# Patient Record
Sex: Male | Born: 1955 | Race: White | Marital: Married | Smoking: Current every day smoker
Health system: Southern US, Community
[De-identification: ages and names within clinical notes are randomized; demographics above are authoritative.]

## PROBLEM LIST (undated history)

## (undated) DIAGNOSIS — F329 Major depressive disorder, single episode, unspecified: Secondary | ICD-10-CM

## (undated) DIAGNOSIS — D126 Benign neoplasm of colon, unspecified: Secondary | ICD-10-CM

## (undated) DIAGNOSIS — I714 Abdominal aortic aneurysm, without rupture, unspecified: Secondary | ICD-10-CM

## (undated) DIAGNOSIS — F1021 Alcohol dependence, in remission: Secondary | ICD-10-CM

## (undated) DIAGNOSIS — F419 Anxiety disorder, unspecified: Secondary | ICD-10-CM

## (undated) DIAGNOSIS — K219 Gastro-esophageal reflux disease without esophagitis: Secondary | ICD-10-CM

## (undated) DIAGNOSIS — E7800 Pure hypercholesterolemia, unspecified: Secondary | ICD-10-CM

## (undated) DIAGNOSIS — G44019 Episodic cluster headache, not intractable: Secondary | ICD-10-CM

## (undated) DIAGNOSIS — I10 Essential (primary) hypertension: Secondary | ICD-10-CM

## (undated) DIAGNOSIS — F32A Depression, unspecified: Secondary | ICD-10-CM

## (undated) DIAGNOSIS — E785 Hyperlipidemia, unspecified: Secondary | ICD-10-CM

## (undated) DIAGNOSIS — R319 Hematuria, unspecified: Secondary | ICD-10-CM

## (undated) HISTORY — DX: Pure hypercholesterolemia, unspecified: E78.00

## (undated) HISTORY — DX: Alcohol dependence, in remission: F10.21

## (undated) HISTORY — DX: Essential (primary) hypertension: I10

## (undated) HISTORY — PX: TONSILLECTOMY: SUR1361

## (undated) HISTORY — DX: Episodic cluster headache, not intractable: G44.019

## (undated) HISTORY — DX: Gastro-esophageal reflux disease without esophagitis: K21.9

## (undated) HISTORY — DX: Abdominal aortic aneurysm, without rupture: I71.4

## (undated) HISTORY — DX: Hyperlipidemia, unspecified: E78.5

## (undated) HISTORY — DX: Abdominal aortic aneurysm, without rupture, unspecified: I71.40

## (undated) HISTORY — DX: Benign neoplasm of colon, unspecified: D12.6

## (undated) HISTORY — DX: Anxiety disorder, unspecified: F41.9

## (undated) HISTORY — DX: Depression, unspecified: F32.A

## (undated) HISTORY — DX: Hematuria, unspecified: R31.9

## (undated) HISTORY — PX: TENDON GRAFT: SHX2486

---
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
## 2015-07-10 ENCOUNTER — Other Ambulatory Visit: Payer: Self-pay | Admitting: Orthopedic Surgery

## 2015-07-10 DIAGNOSIS — M25511 Pain in right shoulder: Secondary | ICD-10-CM

## 2015-07-26 ENCOUNTER — Other Ambulatory Visit: Payer: Self-pay

## 2018-03-05 ENCOUNTER — Ambulatory Visit
Admission: RE | Admit: 2018-03-05 | Discharge: 2018-03-05 | Disposition: A | Discharge status: Home / Self Care | Payer: BC Managed Care – PPO | Source: Ambulatory Visit | Attending: Family Medicine | Admitting: Family Medicine

## 2018-03-05 ENCOUNTER — Other Ambulatory Visit: Payer: Self-pay | Admitting: Family Medicine

## 2018-03-05 DIAGNOSIS — R05 Cough: Secondary | ICD-10-CM

## 2018-03-05 DIAGNOSIS — R059 Cough, unspecified: Secondary | ICD-10-CM

## 2018-03-05 DIAGNOSIS — R042 Hemoptysis: Secondary | ICD-10-CM

## 2018-08-04 DIAGNOSIS — M79671 Pain in right foot: Secondary | ICD-10-CM | POA: Insufficient documentation

## 2018-08-31 DIAGNOSIS — S93139A Subluxation of interphalangeal joint of unspecified toe(s), initial encounter: Secondary | ICD-10-CM | POA: Insufficient documentation

## 2018-08-31 DIAGNOSIS — M25571 Pain in right ankle and joints of right foot: Secondary | ICD-10-CM | POA: Insufficient documentation

## 2018-08-31 DIAGNOSIS — Q6651 Congenital pes planus, right foot: Secondary | ICD-10-CM | POA: Insufficient documentation

## 2018-08-31 DIAGNOSIS — M2011 Hallux valgus (acquired), right foot: Secondary | ICD-10-CM | POA: Insufficient documentation

## 2020-06-21 ENCOUNTER — Other Ambulatory Visit: Payer: Self-pay | Admitting: Family Medicine

## 2020-06-21 DIAGNOSIS — R319 Hematuria, unspecified: Secondary | ICD-10-CM

## 2020-06-22 ENCOUNTER — Other Ambulatory Visit: Payer: Self-pay

## 2020-06-22 ENCOUNTER — Ambulatory Visit
Admission: RE | Admit: 2020-06-22 | Discharge: 2020-06-22 | Disposition: A | Discharge status: Home / Self Care | Payer: BC Managed Care – PPO | Source: Ambulatory Visit | Attending: Family Medicine | Admitting: Family Medicine

## 2020-06-22 DIAGNOSIS — R319 Hematuria, unspecified: Secondary | ICD-10-CM

## 2020-06-22 MED ORDER — IOPAMIDOL (ISOVUE-300) INJECTION 61%
125.0000 mL | Freq: Once | INTRAVENOUS | Status: AC | PRN
  Administered 2020-06-22: 125 mL via INTRAVENOUS

## 2020-07-04 ENCOUNTER — Other Ambulatory Visit: Payer: BC Managed Care – PPO

## 2020-07-06 ENCOUNTER — Other Ambulatory Visit: Payer: Self-pay

## 2020-07-06 ENCOUNTER — Encounter: Payer: Self-pay | Admitting: Vascular Surgery

## 2020-07-06 ENCOUNTER — Ambulatory Visit (INDEPENDENT_AMBULATORY_CARE_PROVIDER_SITE_OTHER): Payer: BC Managed Care – PPO | Admitting: Vascular Surgery

## 2020-07-06 ENCOUNTER — Telehealth: Payer: Self-pay

## 2020-07-06 VITALS — BP 154/89 | HR 73 | Temp 98.6°F | Resp 20 | Ht 71.0 in | Wt 233.0 lb

## 2020-07-06 DIAGNOSIS — I714 Abdominal aortic aneurysm, without rupture, unspecified: Secondary | ICD-10-CM

## 2020-07-06 NOTE — Progress Notes (Addendum)
Referring Physician: Dr Marisue Humble  Patient name: Benjamin Preston MRN: [id number] DOB: [birth year] Sex: male  REASON FOR CONSULT: Abdominal aortic aneurysm  HPI: Benjamin Preston is a 65 y.o. male, found to have a 7 cm infrarenal abdominal aortic aneurysm on CT scan recently done for hematuria.  Patient has no abdominal or back pain.  He has no family history of abdominal aortic aneurysm.  He does smoke about 1/4 pack of cigarettes per day.  He has a 40-year smoking history.  He is trying to quit.  He has never had any abdominal operations.  He gets short of breath with severe exertion.  However he can climb 2 flights of stairs without getting short of breath.  He has no chest pain.  He has no prior history of stroke.  Other medical problems include depression, hyperlipidemia hypertension all of which are currently stable.  Past Medical History:  Diagnosis Date  . Anxiety   . Depression   . Hyperlipidemia   . Hypertension    Past Surgical History:  Procedure Laterality Date  . TENDON GRAFT    . TONSILLECTOMY      History reviewed. No pertinent family history.  SOCIAL HISTORY: Social History   Socioeconomic History  . Marital status: Married    Spouse name: Not on file  . Number of children: Not on file  . Years of education: Not on file  . Highest education level: Not on file  Occupational History  . Not on file  Tobacco Use  . Smoking status: Current Every Day Smoker    Packs/day: 0.50  . Smokeless tobacco: Never Used  Vaping Use  . Vaping Use: Never used  Substance and Sexual Activity  . Alcohol use: Not Currently  . Drug use: Never  . Sexual activity: Not on file  Other Topics Concern  . Not on file  Social History Narrative  . Not on file   Social Determinants of Health   Financial Resource Strain:   . Difficulty of Paying Living Expenses: Not on file  Food Insecurity:   . Worried About Charity fundraiser in the Last Year: Not on file  . Ran Out of Food in the  Last Year: Not on file  Transportation Needs:   . Lack of Transportation (Medical): Not on file  . Lack of Transportation (Non-Medical): Not on file  Physical Activity:   . Days of Exercise per Week: Not on file  . Minutes of Exercise per Session: Not on file  Stress:   . Feeling of Stress : Not on file  Social Connections:   . Frequency of Communication with Friends and Family: Not on file  . Frequency of Social Gatherings with Friends and Family: Not on file  . Attends Religious Services: Not on file  . Active Member of Clubs or Organizations: Not on file  . Attends Archivist Meetings: Not on file  . Marital Status: Not on file  Intimate Partner Violence:   . Fear of Current or Ex-Partner: Not on file  . Emotionally Abused: Not on file  . Physically Abused: Not on file  . Sexually Abused: Not on file    No Known Allergies  Current Outpatient Medications  Medication Sig Dispense Refill  . ALPRAZolam (XANAX) 0.5 MG tablet TK 1/2 TO 1 T PO QD PRN    . escitalopram (LEXAPRO) 10 MG tablet Take 10 mg by mouth daily.    . finasteride (PROSCAR) 5 MG tablet Take 5 mg  by mouth daily.    . metoprolol succinate (TOPROL-XL) 25 MG 24 hr tablet Take 25 mg by mouth daily.    . simvastatin (ZOCOR) 20 MG tablet Take 20 mg by mouth daily.     No current facility-administered medications for this visit.    ROS:   General:  No weight loss, Fever, chills  HEENT: No recent headaches, no nasal bleeding, no visual changes, no sore throat  Neurologic: No dizziness, blackouts, seizures. No recent symptoms of stroke or mini- stroke. No recent episodes of slurred speech, or temporary blindness.  Cardiac: No recent episodes of chest pain/pressure, no shortness of breath at rest.  No shortness of breath with exertion.  Denies history of atrial fibrillation or irregular heartbeat  Vascular: No history of rest pain in feet.  No history of claudication.  No history of non-healing ulcer, No  history of DVT   Pulmonary: No home oxygen, no productive cough, no hemoptysis,  No asthma or wheezing  Musculoskeletal:  [ ]  Arthritis, [ ]  Low back pain,  [ ]  Joint pain  Hematologic:No history of hypercoagulable state.  No history of easy bleeding.  No history of anemia  Gastrointestinal: No hematochezia or melena,  No gastroesophageal reflux, no trouble swallowing  Urinary: [ ]  chronic Kidney disease, [ ]  on HD - [ ]  MWF or [ ]  TTHS, [ ]  Burning with urination, [ ]  Frequent urination, [ ]  Difficulty urinating;   Skin: No rashes  Psychological: No history of anxiety,  No history of depression   Physical Examination  Vitals:   07/06/20 0835  BP: (!) 154/89  Pulse: 73  Resp: 20  Temp: 98.6 F (37 C)  SpO2: 94%  Weight: 233 lb (105.7 kg)  Height: 5\' 11"  (1.803 m)    Body mass index is 32.5 kg/m.  General:  Alert and oriented, no acute distress HEENT: Normal Neck: No JVD Cardiac: Regular Rate and Rhythm without murmur Abdomen: Soft, non-tender, non-distended, slight epigastric mass consistent with 7 cm aneurysm nontender Skin: No rash Extremity Pulses:  2+ radial, brachial, femoral, dorsalis pedis, posterior tibial pulses bilaterally Musculoskeletal: No deformity or edema  Neurologic: Upper and lower extremity motor 5/5 and symmetric  DATA:  I reviewed the patient's CT scan of the abdomen and pelvis dated June 22, 2020.  Shows a 7 cm infrarenal abdominal aortic aneurysm with some neck tortuosity.  The neck diameter is about 28 mm.  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  He also has very tortuous iliacs.  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  Ectatic left iliac.  Size needs to be considered for repair.  I have some concerns about the tortuosity of the iliacs and aortic neck that this may not accommodate an aortic  stent graft.  I think there is a high likelihood we will need to do an open repair but will review these images with the [employer] rep to see if the device may fit.  If not he would need an open repair.  I discussed with him today the possibility of open repair with risks benefits possible complications of procedure details including but not limited to risk of rupture with no repair 5 to 10 %/year.  Risk of death with operation 1 to 2%.  Risk of transfusion 20%.  Other small risks pneumonias infections hernia intestinal issues all fairly low  risk.   PLAN: Patient will be scheduled for cardiology risk stratification appointment.  I reviewed the CT images and make a final determination on open versus stent graft repair in the next few days.  I will call the patient regarding this.  Ruta Hinds, MD [facility] Office: [phone]  Addendum: July 07, 2020 at 11 AM.  We made measurements off of his CT scan today.  His aneurysm configuration should be okay for aneurysm stent graft rather than open repair.  We will need to do this as a two-stage procedure.  We will get him scheduled for coil embolization of his right internal iliac either on August 27 or September 3.  We will contact the patient regarding this.  He has a cardiology evaluation scheduled for August 24 with Dr. Irish Lack.

## 2020-07-06 NOTE — Telephone Encounter (Signed)
Received message from [facility] requesting ASAP new patient appointment for surgical clearance.  Spoke with Dr. Hassell Done nurse and arrange a visit with the patient 8/24 at [facility].  Left message for the patient to call back.

## 2020-07-06 NOTE — Telephone Encounter (Signed)
Confirmed appointment with patient 8/24 with Dr. Irish Lack. He was grateful for call and agrees with plan.

## 2020-07-06 NOTE — H&P (View-Only) (Signed)
Referring Physician: Dr Marisue Humble  Patient name: Benjamin Preston MRN: [id number] DOB: Sep 16, 1956 Sex: male  REASON FOR CONSULT: Abdominal aortic aneurysm  HPI: Yoseph Haile is a 64 y.o. male, found to have a 7 cm infrarenal abdominal aortic aneurysm on CT scan recently done for hematuria.  Patient has no abdominal or back pain.  He has no family history of abdominal aortic aneurysm.  He does smoke about 1/4 pack of cigarettes per day.  He has a 40-year smoking history.  He is trying to quit.  He has never had any abdominal operations.  He gets short of breath with severe exertion.  However he can climb 2 flights of stairs without getting short of breath.  He has no chest pain.  He has no prior history of stroke.  Other medical problems include depression, hyperlipidemia hypertension all of which are currently stable.  Past Medical History:  Diagnosis Date  . Anxiety   . Depression   . Hyperlipidemia   . Hypertension    Past Surgical History:  Procedure Laterality Date  . TENDON GRAFT    . TONSILLECTOMY      History reviewed. No pertinent family history.  SOCIAL HISTORY: Social History   Socioeconomic History  . Marital status: Married    Spouse name: Not on file  . Number of children: Not on file  . Years of education: Not on file  . Highest education level: Not on file  Occupational History  . Not on file  Tobacco Use  . Smoking status: Current Every Day Smoker    Packs/day: 0.50  . Smokeless tobacco: Never Used  Vaping Use  . Vaping Use: Never used  Substance and Sexual Activity  . Alcohol use: Not Currently  . Drug use: Never  . Sexual activity: Not on file  Other Topics Concern  . Not on file  Social History Narrative  . Not on file   Social Determinants of Health   Financial Resource Strain:   . Difficulty of Paying Living Expenses: Not on file  Food Insecurity:   . Worried About Charity fundraiser in the Last Year: Not on file  . Ran Out of Food in the  Last Year: Not on file  Transportation Needs:   . Lack of Transportation (Medical): Not on file  . Lack of Transportation (Non-Medical): Not on file  Physical Activity:   . Days of Exercise per Week: Not on file  . Minutes of Exercise per Session: Not on file  Stress:   . Feeling of Stress : Not on file  Social Connections:   . Frequency of Communication with Friends and Family: Not on file  . Frequency of Social Gatherings with Friends and Family: Not on file  . Attends Religious Services: Not on file  . Active Member of Clubs or Organizations: Not on file  . Attends Archivist Meetings: Not on file  . Marital Status: Not on file  Intimate Partner Violence:   . Fear of Current or Ex-Partner: Not on file  . Emotionally Abused: Not on file  . Physically Abused: Not on file  . Sexually Abused: Not on file    No Known Allergies  Current Outpatient Medications  Medication Sig Dispense Refill  . ALPRAZolam (XANAX) 0.5 MG tablet TK 1/2 TO 1 T PO QD PRN    . escitalopram (LEXAPRO) 10 MG tablet Take 10 mg by mouth daily.    . finasteride (PROSCAR) 5 MG tablet Take 5 mg  by mouth daily.    . metoprolol succinate (TOPROL-XL) 25 MG 24 hr tablet Take 25 mg by mouth daily.    . simvastatin (ZOCOR) 20 MG tablet Take 20 mg by mouth daily.     No current facility-administered medications for this visit.    ROS:   General:  No weight loss, Fever, chills  HEENT: No recent headaches, no nasal bleeding, no visual changes, no sore throat  Neurologic: No dizziness, blackouts, seizures. No recent symptoms of stroke or mini- stroke. No recent episodes of slurred speech, or temporary blindness.  Cardiac: No recent episodes of chest pain/pressure, no shortness of breath at rest.  No shortness of breath with exertion.  Denies history of atrial fibrillation or irregular heartbeat  Vascular: No history of rest pain in feet.  No history of claudication.  No history of non-healing ulcer, No  history of DVT   Pulmonary: No home oxygen, no productive cough, no hemoptysis,  No asthma or wheezing  Musculoskeletal:  [ ]  Arthritis, [ ]  Low back pain,  [ ]  Joint pain  Hematologic:No history of hypercoagulable state.  No history of easy bleeding.  No history of anemia  Gastrointestinal: No hematochezia or melena,  No gastroesophageal reflux, no trouble swallowing  Urinary: [ ]  chronic Kidney disease, [ ]  on HD - [ ]  MWF or [ ]  TTHS, [ ]  Burning with urination, [ ]  Frequent urination, [ ]  Difficulty urinating;   Skin: No rashes  Psychological: No history of anxiety,  No history of depression   Physical Examination  Vitals:   07/06/20 0835  BP: (!) 154/89  Pulse: 73  Resp: 20  Temp: 98.6 F (37 C)  SpO2: 94%  Weight: 233 lb (105.7 kg)  Height: 5\' 11"  (1.803 m)    Body mass index is 32.5 kg/m.  General:  Alert and oriented, no acute distress HEENT: Normal Neck: No JVD Cardiac: Regular Rate and Rhythm without murmur Abdomen: Soft, non-tender, non-distended, slight epigastric mass consistent with 7 cm aneurysm nontender Skin: No rash Extremity Pulses:  2+ radial, brachial, femoral, dorsalis pedis, posterior tibial pulses bilaterally Musculoskeletal: No deformity or edema  Neurologic: Upper and lower extremity motor 5/5 and symmetric  DATA:  I reviewed the patient's CT scan of the abdomen and pelvis dated June 22, 2020.  Shows a 7 cm infrarenal abdominal aortic aneurysm with some neck tortuosity.  The neck diameter is about 28 mm.  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  He also has very tortuous iliacs.  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  Ectatic left iliac.  Size needs to be considered for repair.  I have some concerns about the tortuosity of the iliacs and aortic neck that this may not accommodate an aortic  stent graft.  I think there is a high likelihood we will need to do an open repair but will review these images with the [employer] rep to see if the device may fit.  If not he would need an open repair.  I discussed with him today the possibility of open repair with risks benefits possible complications of procedure details including but not limited to risk of rupture with no repair 5 to 10 %/year.  Risk of death with operation 1 to 2%.  Risk of transfusion 20%.  Other small risks pneumonias infections hernia intestinal issues all fairly low  risk.   PLAN: Patient will be scheduled for cardiology risk stratification appointment.  I reviewed the CT images and make a final determination on open versus stent graft repair in the next few days.  I will call the patient regarding this.  Ruta Hinds, MD [facility] Office: [phone]  Addendum: July 07, 2020 at 11 AM.  We made measurements off of his CT scan today.  His aneurysm configuration should be okay for aneurysm stent graft rather than open repair.  We will need to do this as a two-stage procedure.  We will get him scheduled for coil embolization of his right internal iliac either on August 27 or September 3.  We will contact the patient regarding this.  He has a cardiology evaluation scheduled for August 24 with Dr. Irish Lack.

## 2020-07-07 ENCOUNTER — Telehealth: Payer: Self-pay

## 2020-07-07 ENCOUNTER — Other Ambulatory Visit: Payer: Self-pay

## 2020-07-07 NOTE — Telephone Encounter (Signed)
Per Dr. Oneida Alar, I called pt to schedule a Coil Embolization of his internal artery aneurysm using an OSCOR sheath for either 08/27 or 07/21/20.  No answer.  I left voice message asking pt to call me back.  Thurston Hole., LPN

## 2020-07-07 NOTE — Telephone Encounter (Addendum)
Patient Benjamin Preston stating he would like a call from Dr. Oneida Alar directly to talk about the surgery and to ask a few questions. I returned patient's call and advised I will speak with Dr. Oneida Alar on Monday and that he will give him a call to discuss. Patient voiced his understanding.

## 2020-07-11 ENCOUNTER — Ambulatory Visit: Payer: BC Managed Care – PPO | Admitting: Interventional Cardiology

## 2020-07-11 ENCOUNTER — Other Ambulatory Visit: Payer: Self-pay

## 2020-07-11 ENCOUNTER — Encounter: Payer: Self-pay | Admitting: Interventional Cardiology

## 2020-07-11 VITALS — BP 140/96 | HR 69 | Ht 71.0 in | Wt 232.2 lb

## 2020-07-11 DIAGNOSIS — I10 Essential (primary) hypertension: Secondary | ICD-10-CM | POA: Diagnosis not present

## 2020-07-11 DIAGNOSIS — Z720 Tobacco use: Secondary | ICD-10-CM

## 2020-07-11 DIAGNOSIS — E782 Mixed hyperlipidemia: Secondary | ICD-10-CM

## 2020-07-11 DIAGNOSIS — I714 Abdominal aortic aneurysm, without rupture, unspecified: Secondary | ICD-10-CM

## 2020-07-11 DIAGNOSIS — Z01810 Encounter for preprocedural cardiovascular examination: Secondary | ICD-10-CM | POA: Diagnosis not present

## 2020-07-11 MED ORDER — ROSUVASTATIN CALCIUM 20 MG PO TABS: 20.0000 mg | ORAL_TABLET | Freq: Every day | ORAL | 3 refills | Status: AC

## 2020-07-11 NOTE — Progress Notes (Signed)
Cardiology Office Note   Date:  07/11/2020   ID:  Benjamin Preston, DOB 02/02/1956, MRN [id number]  PCP:  Gaynelle Arabian, MD    No chief complaint on file.  Preoperative cardiovascular examination  Wt Readings from Last 3 Encounters:  07/11/20 232 lb 3.2 oz (105.3 kg)  07/06/20 233 lb (105.7 kg)       History of Present Illness: Benjamin Preston is a 64 y.o. male who is being seen today for the evaluation of perioperative risk at the request of Gaynelle Arabian, MD.  He has a history of hypertension.  He was recently found to have a large abdominal aortic aneurysm, 7 cm, noted on a CT scan done for hematuria.  The patient was asymptomatic.  He does have a history of smoking.  It was reported by Dr. Oneida Alar that he can climb 2 flights of stairs without getting short of breath.  He denied chest pain at that time.  Review of the chart shows that initially, it was thought he may need an open repair.  However, it appears there will be chance for stent graft rather than an open repair.  This will be done as a two-stage procedure.  Today in the office, he feels well.  He reports some soreness in his knees.  Feels he has some age appropriate fatigue.  Denies : Chest pain. Dizziness. Leg edema. Nitroglycerin use. Orthopnea. Palpitations. Paroxysmal nocturnal dyspnea. Shortness of breath. Syncope.   He walks daily for 15 minutes, without any cardiac sx.  He has joint pain.    He walks up 2 flights of stairs without any problems.  He is cutting back on smoking.  No early family h/o CAD.  Past Medical History:  Diagnosis Date  . AAA (abdominal aortic aneurysm) ([place])   . Adenomatous colon polyp   . Anxiety   . Depression   . GERD (gastroesophageal reflux disease)   . Headache, cluster, episodic   . Hematuria   . Hypercholesterolemia   . Hyperlipidemia   . Hypertension   . 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 TENDON GRAFT    .  TONSILLECTOMY       Current Outpatient Medications  Medication Sig Dispense Refill  . ALPRAZolam (XANAX) 0.5 MG tablet TK 1/2 TO 1 T PO QD PRN    . escitalopram (LEXAPRO) 10 MG tablet Take 10 mg by mouth daily.    . finasteride (PROSCAR) 5 MG tablet Take 5 mg by mouth daily.    . metoprolol succinate (TOPROL-XL) 25 MG 24 hr tablet Take 25 mg by mouth daily.    . simvastatin (ZOCOR) 20 MG tablet Take 20 mg by mouth daily.     No current facility-administered medications for this visit.    Allergies:   Patient has no known allergies.    Social History:  The patient  reports that he has been smoking. He has been smoking about 0.50 packs per day. He has never used smokeless tobacco. He reports previous alcohol use. He reports that he does not use drugs.   Family History:  The patient's family history includes Diverticulitis in his mother; Mesothelioma in his mother; Prostate cancer in his father.    ROS:  Please see the history of present illness.   Otherwise, review of systems are positive for joint pains.   All other systems are reviewed and negative.    PHYSICAL EXAM: VS:  BP (!) 140/96  Pulse 69   Ht 5\' 11"  (1.803 m)   Wt 232 lb 3.2 oz (105.3 kg)   SpO2 95%   BMI 32.39 kg/m  , BMI Body mass index is 32.39 kg/m. GEN: Well nourished, well developed, in no acute distress  HEENT: normal  Neck: no JVD, carotid bruits, or masses Cardiac: RRR; no murmurs, rubs, or gallops,no edema  Respiratory:  clear to auscultation bilaterally, normal work of breathing GI: soft, nontender, nondistended, + BS MS: no deformity or atrophy  Skin: warm and dry, no rash Neuro:  Strength and sensation are intact Psych: euthymic mood, full affect   EKG:   The ekg ordered today demonstrates NSR, no ST changes   Recent Labs: No results found for requested labs within last 8760 hours.   Lipid Panel No results found for: CHOL, TRIG, HDL, CHOLHDL, VLDL, LDLCALC, LDLDIRECT   Other studies  Reviewed: Additional studies/ records that were reviewed today with results demonstrating: Records from vascular surgery reviewed.     ASSESSMENT AND PLAN:  1. Preoperative cardiovascular risk assessment: No cardiac sx with activity.  No further cardiac testing needed at this time.  Would consider screening stress test after stent procedure, given diagnosis of vascular disease, but not needed before stent procedure.  Would recommend aspirin 81 mg daily longterm, not sure if this can be started before AAA repair.   2. Hyperlipidemia: Would change simvastatin to rosuvastatin to 20 mg daily.  Recheck lipids in 8 hours.  3. Hypertension: Metoprolol was restarted.  Consider ACE-I if BP remains high.  He f/u.   4. Tobacco abuse: He needs to quit smoking.  He is cutting back.  Consider a patch at the time of stent placement.   Current medicines are reviewed at length with the patient today.  The patient concerns regarding his medicines were addressed.  The following changes have been made:  No change  Labs/ tests ordered today include:  No orders of the defined types were placed in this encounter.   Recommend 150 minutes/week of aerobic exercise Low fat, low carb, high fiber diet recommended  Disposition:   FU in 2 months, post stent placement   Signed, Larae Grooms, MD  07/11/2020 9:01 AM    [facility] [address], [place], [place]  [address] Phone: [phone]; Fax: [phone]

## 2020-07-11 NOTE — Patient Instructions (Signed)
Medication Instructions:  Your physician has recommended you make the following change in your medication:   1. Stop: simvastatin  2. START: rosuvastatin (crestor) 20 mg tablet: Take 1 tablet by mouth once a day  *If you need a refill on your cardiac medications before your next appointment, please call your pharmacy*   Lab Work: Your physician recommends that you return for a FASTING lipid profile and liver function panel on 10/09/20 the same day as your follow up appointment   If you have labs (blood work) drawn today and your tests are completely normal, you will receive your results only by:  [facility] (if you have MyChart) OR  A paper copy in the mail If you have any lab test that is abnormal or we need to change your treatment, we will call you to review the results.   Testing/Procedures: None   Follow-Up: Follow up with Dr. Irish Lack on 10/09/20 at 8:40 AM   Other Instructions None

## 2020-07-13 ENCOUNTER — Telehealth: Payer: Self-pay

## 2020-07-13 MED ORDER — ASPIRIN EC 81 MG PO TBEC: 81.0000 mg | DELAYED_RELEASE_TABLET | Freq: Every day | ORAL | 3 refills | Status: DC

## 2020-07-13 NOTE — Telephone Encounter (Signed)
Called and instructed patient to start ASA 81 mg QD. He verbalized understanding and thanked me for the call.

## 2020-07-13 NOTE — Telephone Encounter (Signed)
-----   Message from Jettie Booze, MD sent at 07/11/2020  5:48 PM EDT ----- OK to start 81 mg daily.  JV ----- Message ----- From: Elam Dutch, MD Sent: 07/11/2020  10:29 AM EDT To: Jettie Booze, MD  Aspirin in fine.  It looks like he should be and endo candidate so hopefully small operation atlhough 2 stages  Thanks  Ulice Dash ----- Message ----- From: Jettie Booze, MD Sent: 07/11/2020   9:51 AM EDT To: Elam Dutch, MD  Juanda Crumble,  Would recommend aspirin 81 mg daily longterm, not sure if this can be started before AAA repair.  Good exercise capacity.  Will hold off on stress at this time.  JV

## 2020-07-18 ENCOUNTER — Inpatient Hospital Stay (HOSPITAL_COMMUNITY): Admission: RE | Admit: 2020-07-18 | Payer: BC Managed Care – PPO | Source: Ambulatory Visit

## 2020-07-18 ENCOUNTER — Other Ambulatory Visit (HOSPITAL_COMMUNITY)
Admission: RE | Admit: 2020-07-18 | Discharge: 2020-07-18 | Disposition: A | Discharge status: Home / Self Care | Payer: BC Managed Care – PPO | Source: Ambulatory Visit | Attending: Vascular Surgery | Admitting: Vascular Surgery

## 2020-07-18 ENCOUNTER — Ambulatory Visit: Payer: BC Managed Care – PPO | Admitting: Cardiology

## 2020-07-18 DIAGNOSIS — Z01812 Encounter for preprocedural laboratory examination: Secondary | ICD-10-CM | POA: Insufficient documentation

## 2020-07-18 DIAGNOSIS — Z20822 Contact with and (suspected) exposure to covid-19: Secondary | ICD-10-CM | POA: Insufficient documentation

## 2020-07-18 LAB — SARS CORONAVIRUS 2 (TAT 6-24 HRS): SARS Coronavirus 2: NEGATIVE

## 2020-07-21 ENCOUNTER — Other Ambulatory Visit: Payer: Self-pay

## 2020-07-21 ENCOUNTER — Ambulatory Visit (HOSPITAL_COMMUNITY)
Admission: RE | Admit: 2020-07-21 | Discharge: 2020-07-21 | Disposition: A | Discharge status: Home / Self Care | Payer: BC Managed Care – PPO | Attending: Vascular Surgery | Admitting: Vascular Surgery

## 2020-07-21 ENCOUNTER — Inpatient Hospital Stay: Admit: 2020-07-21 | Payer: BC Managed Care – PPO | Admitting: Vascular Surgery

## 2020-07-21 ENCOUNTER — Encounter (HOSPITAL_COMMUNITY)
Admission: RE | Disposition: A | Discharge status: Home / Self Care | Payer: Self-pay | Source: Home / Self Care | Attending: Vascular Surgery

## 2020-07-21 DIAGNOSIS — F329 Major depressive disorder, single episode, unspecified: Secondary | ICD-10-CM | POA: Diagnosis not present

## 2020-07-21 DIAGNOSIS — E785 Hyperlipidemia, unspecified: Secondary | ICD-10-CM | POA: Insufficient documentation

## 2020-07-21 DIAGNOSIS — I723 Aneurysm of iliac artery: Secondary | ICD-10-CM

## 2020-07-21 DIAGNOSIS — F17210 Nicotine dependence, cigarettes, uncomplicated: Secondary | ICD-10-CM | POA: Insufficient documentation

## 2020-07-21 DIAGNOSIS — I10 Essential (primary) hypertension: Secondary | ICD-10-CM | POA: Insufficient documentation

## 2020-07-21 DIAGNOSIS — F419 Anxiety disorder, unspecified: Secondary | ICD-10-CM | POA: Insufficient documentation

## 2020-07-21 DIAGNOSIS — I714 Abdominal aortic aneurysm, without rupture: Secondary | ICD-10-CM | POA: Diagnosis not present

## 2020-07-21 DIAGNOSIS — Z79899 Other long term (current) drug therapy: Secondary | ICD-10-CM | POA: Insufficient documentation

## 2020-07-21 HISTORY — PX: EMBOLIZATION: CATH118239

## 2020-07-21 LAB — POCT I-STAT, CHEM 8
BUN: 19 mg/dL (ref 8–23)
Calcium, Ion: 1.2 mmol/L (ref 1.15–1.40)
Chloride: 107 mmol/L (ref 98–111)
Creatinine, Ser: 0.9 mg/dL (ref 0.61–1.24)
Glucose, Bld: 111 mg/dL — ABNORMAL HIGH (ref 70–99)
HCT: 44 % (ref 39.0–52.0)
Hemoglobin: 15 g/dL (ref 13.0–17.0)
Potassium: 3.9 mmol/L (ref 3.5–5.1)
Sodium: 141 mmol/L (ref 135–145)
TCO2: 25 mmol/L (ref 22–32)

## 2020-07-21 SURGERY — REPAIR, ANEURYSM, ARTERY, POPLITEAL, ENDOVASCULAR
Anesthesia: Choice | Laterality: Right

## 2020-07-21 SURGERY — EMBOLIZATION
Anesthesia: LOCAL | Laterality: Right

## 2020-07-21 MED ORDER — LABETALOL HCL 5 MG/ML IV SOLN: 10.0000 mg | INTRAVENOUS | Status: DC | PRN

## 2020-07-21 MED ORDER — ACETAMINOPHEN 325 MG PO TABS: 650.0000 mg | ORAL_TABLET | ORAL | Status: DC | PRN

## 2020-07-21 MED ORDER — LIDOCAINE HCL (PF) 1 % IJ SOLN
INTRAMUSCULAR | Status: DC | PRN
  Administered 2020-07-21: 20 mL

## 2020-07-21 MED ORDER — SODIUM CHLORIDE 0.9 % IV SOLN: 250.0000 mL | INTRAVENOUS | Status: DC | PRN

## 2020-07-21 MED ORDER — HYDRALAZINE HCL 20 MG/ML IJ SOLN: 5.0000 mg | INTRAMUSCULAR | Status: DC | PRN

## 2020-07-21 MED ORDER — HEPARIN (PORCINE) IN NACL 1000-0.9 UT/500ML-% IV SOLN
INTRAVENOUS | Status: DC | PRN
  Administered 2020-07-21 (×2): 500 mL

## 2020-07-21 MED ORDER — HEPARIN (PORCINE) IN NACL 1000-0.9 UT/500ML-% IV SOLN
INTRAVENOUS | Status: AC
  Filled 2020-07-21: qty 500

## 2020-07-21 MED ORDER — SODIUM CHLORIDE 0.9 % IV SOLN: INTRAVENOUS | Status: DC

## 2020-07-21 MED ORDER — LIDOCAINE HCL (PF) 1 % IJ SOLN
INTRAMUSCULAR | Status: AC
  Filled 2020-07-21: qty 30

## 2020-07-21 MED ORDER — SODIUM CHLORIDE 0.9 % IV SOLN: INTRAVENOUS | Status: AC

## 2020-07-21 MED ORDER — ONDANSETRON HCL 4 MG/2ML IJ SOLN: 4.0000 mg | Freq: Four times a day (QID) | INTRAMUSCULAR | Status: DC | PRN

## 2020-07-21 MED ORDER — MORPHINE SULFATE (PF) 2 MG/ML IV SOLN: 2.0000 mg | INTRAVENOUS | Status: DC | PRN

## 2020-07-21 MED ORDER — IODIXANOL 320 MG/ML IV SOLN
INTRAVENOUS | Status: DC | PRN
  Administered 2020-07-21: 140 mL via INTRA_ARTERIAL

## 2020-07-21 MED ORDER — SODIUM CHLORIDE 0.9% FLUSH: 3.0000 mL | INTRAVENOUS | Status: DC | PRN

## 2020-07-21 MED ORDER — OXYCODONE HCL 5 MG PO TABS: 5.0000 mg | ORAL_TABLET | ORAL | Status: DC | PRN

## 2020-07-21 MED ORDER — SODIUM CHLORIDE 0.9% FLUSH: 3.0000 mL | Freq: Two times a day (BID) | INTRAVENOUS | Status: DC

## 2020-07-21 SURGICAL SUPPLY — 20 items
CATH ANGIO 5F PIGTAIL 65CM (CATHETERS) ×2 IMPLANT
CATH BEACON 5 .035 65 KMP TIP (CATHETERS) ×2 IMPLANT
CATH CROSS OVER TEMPO 5F (CATHETERS) ×2 IMPLANT
CATH SOFT-VU ST 4F 90CM (CATHETERS) ×2 IMPLANT
COIL EMBL 14X103.2 LOOP (Embolic) ×3 IMPLANT
COIL NESTER 14X10 (Embolic) ×3 IMPLANT
COIL NESTER 14X12 (Embolic) ×2 IMPLANT
COIL NESTER 14X6 (Embolic) ×4 IMPLANT
COIL NESTER 14X8 (Embolic) ×8 IMPLANT
DEVICE TORQUE .025-.038 (MISCELLANEOUS) ×2 IMPLANT
GLIDEWIRE ADV .035X180CM (WIRE) ×2 IMPLANT
KIT PV (KITS) ×2 IMPLANT
SHEATH GUIDING 6.5 FR 55X73X9 (SHEATH) ×2 IMPLANT
SHEATH PINNACLE 5F 10CM (SHEATH) ×2 IMPLANT
SHEATH PINNACLE 7F 10CM (SHEATH) ×2 IMPLANT
SHEATH PROBE COVER 6X72 (BAG) ×2 IMPLANT
SYR MEDRAD MARK V 150ML (SYRINGE) ×2 IMPLANT
TRANSDUCER W/STOPCOCK (MISCELLANEOUS) ×2 IMPLANT
TRAY PV CATH (CUSTOM PROCEDURE TRAY) ×2 IMPLANT
WIRE BENTSON .035X145CM (WIRE) ×2 IMPLANT

## 2020-07-21 NOTE — Discharge Instructions (Signed)
Femoral Site Care This sheet gives you information about how to care for yourself after your procedure. Your health care provider may also give you more specific instructions. If you have problems or questions, contact your health care provider. What can I expect after the procedure?  After the procedure, it is common to have:  Bruising that usually fades within 1-2 weeks.  Tenderness at the site. Follow these instructions at home: Wound care 1. Follow instructions from your health care provider about how to take care of your insertion site. Make sure you: ? Wash your hands with soap and water before you change your bandage (dressing). If soap and water are not available, use hand sanitizer. ? Remove your dressing as told by your health care provider. In 24 hours 2. Do not take baths, swim, or use a hot tub until your health care provider approves. 3. You may shower 24-48 hours after the procedure or as told by your health care provider. ? Gently wash the site with plain soap and water. ? Pat the area dry with a clean towel. ? Do not rub the site. This may cause bleeding. 4. Do not apply powder or lotion to the site. Keep the site clean and dry. 5. Check your femoral site every day for signs of infection. Check for: ? Redness, swelling, or pain. ? Fluid or blood. ? Warmth. ? Pus or a bad smell. Activity 1. For the first 2-3 days after your procedure, or as long as directed: ? Avoid climbing stairs as much as possible. ? Do not squat. 2. Do not lift anything that is heavier than 10 lb (4.5 kg), or the limit that you are told, until your health care provider says that it is safe. For 5 days 3. Rest as directed. ? Avoid sitting for a long time without moving. Get up to take short walks every 1-2 hours. 4. Do not drive for 24 hours if you were given a medicine to help you relax (sedative). General instructions  Take over-the-counter and prescription medicines only as told by your  health care provider.  Keep all follow-up visits as told by your health care provider. This is important. Contact a health care provider if you have:  A fever or chills.  You have redness, swelling, or pain around your insertion site. Get help right away if:  The catheter insertion area swells very fast.  You pass out.  You suddenly start to sweat or your skin gets clammy.  The catheter insertion area is bleeding, and the bleeding does not stop when you hold steady pressure on the area.  The area near or just beyond the catheter insertion site becomes pale, cool, tingly, or numb. These symptoms may represent a serious problem that is an emergency. Do not wait to see if the symptoms will go away. Get medical help right away. Call your local emergency services (911 in the [place] Do not drive yourself to the hospital. Summary  After the procedure, it is common to have bruising that usually fades within 1-2 weeks.  Check your femoral site every day for signs of infection.  Do not lift anything that is heavier than 10 lb (4.5 kg), or the limit that you are told, until your health care provider says that it is safe. This information is not intended to replace advice given to you by your health care provider. Make sure you discuss any questions you have with your health care provider. Document Revised: 11/17/2017 Document Reviewed: 11/17/2017   Elsevier Patient Education  2020 [employer]   

## 2020-07-21 NOTE — Interval H&P Note (Signed)
History and Physical Interval Note:  07/21/2020 12:49 PM  Benjamin Preston  has presented today for surgery, with the diagnosis of triple A.  The various methods of treatment have been discussed with the patient and family. After consideration of risks, benefits and other options for treatment, the patient has consented to  Procedure(s) with comments: EMBOLIZATION (Right) - external iliac as a surgical intervention.  The patient's history has been reviewed, patient examined, no change in status, stable for surgery.  I have reviewed the patient's chart and labs.  Questions were answered to the patient's satisfaction.     Ruta Hinds

## 2020-07-21 NOTE — Op Note (Signed)
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  Tortuous iliacs  2.  Coil embolization right internal iliac artery  Operative details: After team informed consent, the patient is taken the [facility] lab.  The patient was placed supine position angio table.  Both groins were prepped and draped usual sterile fashion.  Local anesthesia was then treated of the left common femoral artery.  Ultrasound was used to identify the left common femoral artery and femoral bifurcation.  An introducer needle was then used to cannulate the left common femoral artery and an 035 Bentson wire threaded up the abdominal aorta under fluoroscopic guidance.  A 5 French sheath placed over the guidewire and left common femoral artery.  This was thoroughly flushed with heparinized saline.  A 5 French pigtail catheter was then advanced over the guidewire and abdominal aortogram was obtained in AP projection.  Left and right renal arteries are patent.  There is about a 3 cm aortic neck above and abdominal aortic aneurysm and bilateral common iliac artery aneurysms.  Due to filling of the aneurysm contrast opacification was fairly poor so we pulled the catheter down just above the aortic bifurcation and shot several views to try to determine the exact origin of the right internal iliac artery.  This was best projected on an AP view.  Next an 035 crossover catheter was placed over the Bentson wire and I tried to get this to advance down into the right internal iliac artery from the contralateral side.  However due to vessel tortuosity I could not get the guidewire to engage.  I then asked swapped this out for a KMP catheter and a 035 Glidewire advantage.  I was able get the Glidewire advantage down to the distal  right external iliac artery and on pulling this back I tried to engage the internal but still was not able to get enough wire purchase.  Therefore at this point over the Glidewire advantage the 5 [place] sheath was swapped out for 6 and half [place] Oscor sheath.  I was able to articulate this into the origin of the right internal iliac artery and able to pass a Glidewire advantage out into the distal branches of the right internal iliac artery.  This was confirmed with angiography.  I then proceeded to place a 4 French straight catheter over the Glidewire into the distal right internal iliac artery.  We then proceeded to place several 10 8 and 6 Nester coils stacking these tightly into the aneurysm and all the way back up to the origin of the right internal iliac artery.  A total of 9 coils were deployed.  At this point the Oscor sheath was backed out over a guidewire and swapped out for a 7 [place] short sheath in the left common femoral artery.  This was thoroughly flushed with saline.  The patient tolerated procedure well and there were no complications.  Patient was taken to the holding area in stable condition.  Patient will be scheduled for a Gore Excluder stent graft to fix the remainder of his common and infrarenal abdominal aortic aneurysms on Tuesday August 15 2020.  Ruta Hinds, MD [facility] Office: [phone]

## 2020-07-21 NOTE — Progress Notes (Addendum)
Site area: left groin fa sheath Site Prior to Removal:  Level 0 Pressure Applied For: 50 minutes Manual:   yes Patient Status During Pull:  stable Post Pull Site:  Level 0 Post Pull Instructions Given:  yes Post Pull Pulses Present: left dp palpable Dressing Applied:  Gauze and tegaderm Bedrest begins @ 1350 Comments:

## 2020-07-21 NOTE — Progress Notes (Signed)
Up and walked and tolerated well; left groin stable, no bleeding or hematoma 

## 2020-07-25 ENCOUNTER — Encounter (HOSPITAL_COMMUNITY): Payer: Self-pay | Admitting: Vascular Surgery

## 2020-07-28 ENCOUNTER — Other Ambulatory Visit: Payer: Self-pay

## 2020-08-11 ENCOUNTER — Inpatient Hospital Stay (HOSPITAL_COMMUNITY): Admission: RE | Admit: 2020-08-11 | Payer: BC Managed Care – PPO | Source: Ambulatory Visit

## 2020-08-11 NOTE — Progress Notes (Signed)
[id number] [address], [place] [address] Phone: [phone] Fax: [phone]      Your procedure is scheduled on Tuesday, September 28th.  Report to [facility] Main Entrance "A" at 5:30 A.M., and check in at the Admitting office.  Call this number if you have problems the morning of surgery:  [phone]  Call [phone] if you have any questions prior to your surgery date Monday-Friday 8am-4pm    Remember:  Do not eat or drink after midnight the night before your surgery     Take these medicines the morning of surgery with A SIP OF WATER   Alprazolam (Xanax)  Escitalopram (Xanax)  Metoprolol  Rosuvastatin (Crestor)  Follow your surgeon's instructions on when to stop Aspirin.  If no instructions were given by your surgeon then you will need to call the office to get those instructions.    As of today, STOP taking Aleve, Naproxen, Ibuprofen, Motrin, Advil, Goody's, BC's, all herbal medications, fish oil, and all vitamins.                      Do not wear jewelry            Do not wear lotions, powders, colognes, or deodorant.            Men may shave face and neck.            Do not bring valuables to the hospital.            [facility] is not responsible for any belongings or valuables.  Do NOT Smoke (Tobacco/Vaping) or drink Alcohol 24 hours prior to your procedure If you use a CPAP at night, you may bring all equipment for your overnight stay.   Contacts, glasses, dentures or bridgework may not be worn into surgery.      For patients admitted to the hospital, discharge time will be determined by your treatment team.   Patients discharged the day of surgery will not be allowed to drive home, and someone needs to stay with them for 24 hours.    Special instructions:   [facility]- Preparing For Surgery  Before surgery, you can play an important role. Because skin is not sterile, your skin needs to be as  free of germs as possible. You can reduce the number of germs on your skin by washing with CHG (chlorahexidine gluconate) Soap before surgery.  CHG is an antiseptic cleaner which kills germs and bonds with the skin to continue killing germs even after washing.    Oral Hygiene is also important to reduce your risk of infection.  Remember - BRUSH YOUR TEETH THE MORNING OF SURGERY WITH YOUR REGULAR TOOTHPASTE  Please do not use if you have an allergy to CHG or antibacterial soaps. If your skin becomes reddened/irritated stop using the CHG.  Do not shave (including legs and underarms) for at least 48 hours prior to first CHG shower. It is OK to shave your face.  Please follow these instructions carefully.   1. Shower the NIGHT BEFORE SURGERY and the MORNING OF SURGERY with CHG Soap.   2. If you chose to wash your hair, wash your hair first as usual with your normal shampoo.  3. After you shampoo, rinse your hair and body thoroughly to remove the shampoo.  4. Use CHG as you would any other liquid soap. You can apply CHG directly to the skin and wash gently  with a scrungie or a clean washcloth.   5. Apply the CHG Soap to your body ONLY FROM THE NECK DOWN.  Do not use on open wounds or open sores. Avoid contact with your eyes, ears, mouth and genitals (private parts). Wash Face and genitals (private parts)  with your normal soap.   6. Wash thoroughly, paying special attention to the area where your surgery will be performed.  7. Thoroughly rinse your body with warm water from the neck down.  8. DO NOT shower/wash with your normal soap after using and rinsing off the CHG Soap.  9. Pat yourself dry with a CLEAN TOWEL.  10. Wear CLEAN PAJAMAS to bed the night before surgery  11. Place CLEAN SHEETS on your bed the night of your first shower and DO NOT SLEEP WITH PETS.   Day of Surgery: Wear Clean/Comfortable clothing the morning of surgery Do not apply any deodorants/lotions.   Remember to  brush your teeth WITH YOUR REGULAR TOOTHPASTE.   Please read over the following fact sheets that you were given.

## 2020-08-14 ENCOUNTER — Encounter (HOSPITAL_COMMUNITY)
Admission: RE | Admit: 2020-08-14 | Discharge: 2020-08-14 | Disposition: A | Discharge status: Home / Self Care | Payer: BC Managed Care – PPO | Source: Ambulatory Visit | Attending: Vascular Surgery | Admitting: Vascular Surgery

## 2020-08-14 ENCOUNTER — Encounter (HOSPITAL_COMMUNITY): Payer: Self-pay

## 2020-08-14 ENCOUNTER — Other Ambulatory Visit: Payer: Self-pay

## 2020-08-14 ENCOUNTER — Ambulatory Visit: Payer: BC Managed Care – PPO | Admitting: Cardiology

## 2020-08-14 DIAGNOSIS — Z01812 Encounter for preprocedural laboratory examination: Secondary | ICD-10-CM | POA: Insufficient documentation

## 2020-08-14 DIAGNOSIS — Z20822 Contact with and (suspected) exposure to covid-19: Secondary | ICD-10-CM | POA: Insufficient documentation

## 2020-08-14 LAB — COMPREHENSIVE METABOLIC PANEL
ALT: 27 U/L (ref 0–44)
AST: 25 U/L (ref 15–41)
Albumin: 4.1 g/dL (ref 3.5–5.0)
Alkaline Phosphatase: 44 U/L (ref 38–126)
Anion gap: 9 (ref 5–15)
BUN: 15 mg/dL (ref 8–23)
CO2: 23 mmol/L (ref 22–32)
Calcium: 9.4 mg/dL (ref 8.9–10.3)
Chloride: 106 mmol/L (ref 98–111)
Creatinine, Ser: 1 mg/dL (ref 0.61–1.24)
GFR calc Af Amer: >60 mL/min (ref >60)
GFR calc non Af Amer: >60 mL/min (ref >60)
Glucose, Bld: 101 mg/dL — ABNORMAL HIGH (ref 70–99)
Potassium: 4.5 mmol/L (ref 3.5–5.1)
Sodium: 138 mmol/L (ref 135–145)
Total Bilirubin: 0.4 mg/dL (ref 0.3–1.2)
Total Protein: 6.6 g/dL (ref 6.5–8.1)

## 2020-08-14 LAB — CBC
HCT: 42.4 % (ref 39.0–52.0)
Hemoglobin: 13.8 g/dL (ref 13.0–17.0)
MCH: 30.5 pg (ref 26.0–34.0)
MCHC: 32.5 g/dL (ref 30.0–36.0)
MCV: 93.6 fL (ref 80.0–100.0)
Platelets: 139 10*3/uL — ABNORMAL LOW (ref 150–400)
RBC: 4.53 MIL/uL (ref 4.22–5.81)
RDW: 13 % (ref 11.5–15.5)
WBC: 6.4 10*3/uL (ref 4.0–10.5)
nRBC: 0 % (ref 0.0–0.2)

## 2020-08-14 LAB — SURGICAL PCR SCREEN
MRSA, PCR: NEGATIVE
Staphylococcus aureus: NEGATIVE

## 2020-08-14 LAB — APTT: aPTT: 34 seconds (ref 24–36)

## 2020-08-14 LAB — PROTIME-INR
INR: 1 (ref 0.8–1.2)
Prothrombin Time: 12.7 seconds (ref 11.4–15.2)

## 2020-08-14 LAB — TYPE AND SCREEN
ABO/RH(D): O POS
Antibody Screen: NEGATIVE

## 2020-08-14 LAB — URINALYSIS, ROUTINE W REFLEX MICROSCOPIC
Bacteria, UA: NONE SEEN
Bilirubin Urine: NEGATIVE
Glucose, UA: NEGATIVE mg/dL
Ketones, ur: NEGATIVE mg/dL
Leukocytes,Ua: NEGATIVE
Nitrite: NEGATIVE
Protein, ur: NEGATIVE mg/dL
Specific Gravity, Urine: 1.009 (ref 1.005–1.030)
pH: 5 (ref 5.0–8.0)

## 2020-08-14 LAB — RESPIRATORY PANEL BY RT PCR (FLU A&B, COVID)
Influenza A by PCR: NEGATIVE
Influenza B by PCR: NEGATIVE
SARS Coronavirus 2 by RT PCR: NEGATIVE

## 2020-08-14 NOTE — Anesthesia Preprocedure Evaluation (Addendum)
Anesthesia Evaluation  Patient identified by MRN, date of birth, ID band  Reviewed: Patient's Chart, lab work & pertinent test results  Airway Mallampati: II  TM Distance: >3 FB Neck ROM: Full    Dental  (+) Upper Dentures   Pulmonary neg pulmonary ROS, Current Smoker and Patient abstained from smoking.,    Pulmonary exam normal        Cardiovascular hypertension, Pt. on medications  Rhythm:Regular Rate:Normal  7cm AAA   Neuro/Psych  Headaches, Anxiety Depression negative neurological ROS     GI/Hepatic Neg liver ROS, GERD  Medicated,  Endo/Other  negative endocrine ROS  Renal/GU negative Renal ROS  negative genitourinary   Musculoskeletal negative musculoskeletal ROS (+)   Abdominal (+)  Abdomen: soft. Bowel sounds: normal.  Peds  Hematology negative hematology ROS (+)   Anesthesia Other Findings   Reproductive/Obstetrics negative OB ROS                           Anesthesia Physical Anesthesia Plan  ASA: III  Anesthesia Plan: General   Post-op Pain Management:    Induction:   PONV Risk Score and Plan: 1 and Ondansetron and Dexamethasone  Airway Management Planned: Oral ETT and Mask  Additional Equipment: Arterial line  Intra-op Plan:   Post-operative Plan: Extubation in OR  Informed Consent: I have reviewed the patients History and Physical, chart, labs and discussed the procedure including the risks, benefits and alternatives for the proposed anesthesia with the patient or authorized representative who has indicated his/her understanding and acceptance.     Dental advisory given  Plan Discussed with:   Anesthesia Plan Comments: (PAT note by Karoline Caldwell, PA-C: Pt was recently found to have a large abdominal aortic aneurysm, 7 cm, noted on a CT scan done for hematuria.Marland Kitchen He was subsequently seen by vascular surgery and then referred for preop cardiology evaluation with Dr.  Irish Lack on 07/11/2020.  Per note, "Preoperative cardiovascular risk assessment: No cardiac sx with activity.  No further cardiac testing needed at this time.  Would consider screening stress test after stent procedure, given diagnosis of vascular disease, but not needed before stent procedure.  Would recommend aspirin 81 mg daily longterm, not sure if this can be started before AAA repair."  Preop labs reviewed, unremarkable.   EKG 07/11/2020: Sinus rhythm.  Rate 68.  )      Anesthesia Quick Evaluation

## 2020-08-14 NOTE — Progress Notes (Signed)
PCP - Dr. Gaynelle Arabian Cardiologist - Dr. Larae Grooms  PPM/ICD - denies  Chest x-ray - N/A EKG - 07/11/2020 Stress Test - denies ECHO - denies Cardiac Cath - denies  Sleep Study - denies CPAP - N/A  DM: denies  Blood Thinner Instructions: N/A Aspirin Instructions: per patient, to continue taking as normal and on the day of surgery  ERAS Protcol - No  COVID TEST- 08/14/2020, test results pending  Anesthesia review: YES, cardiac clearance   Patient denies shortness of breath, fever, cough and chest pain at PAT appointment  All instructions explained to the patient, with a verbal understanding of the material. Patient agrees to go over the instructions while at home for a better understanding. Patient also instructed to self quarantine after being tested for COVID-19. The opportunity to ask questions was provided.

## 2020-08-14 NOTE — Progress Notes (Signed)
Anesthesia Chart Review:  Pt was recently found to have a large abdominal aortic aneurysm, 7 cm, noted on a CT scan done for hematuria.Benjamin Preston He was subsequently seen by vascular surgery and then referred for preop cardiology evaluation with Dr. Irish Lack on 07/11/2020.  Per note, "Preoperative cardiovascular risk assessment: No cardiac sx with activity.  No further cardiac testing needed at this time.  Would consider screening stress test after stent procedure, given diagnosis of vascular disease, but not needed before stent procedure.  Would recommend aspirin 81 mg daily longterm, not sure if this can be started before AAA repair."  Preop labs reviewed, unremarkable.   EKG 07/11/2020: Sinus rhythm.  Rate 68.   Wynonia Musty [facility] Stay [facility]/Anesthesiology Phone [phone] 08/14/2020 2:52 PM

## 2020-08-15 ENCOUNTER — Inpatient Hospital Stay (HOSPITAL_COMMUNITY): Payer: BC Managed Care – PPO | Admitting: Physician Assistant

## 2020-08-15 ENCOUNTER — Encounter (HOSPITAL_COMMUNITY): Admission: RE | Payer: Self-pay | Source: Home / Self Care

## 2020-08-15 ENCOUNTER — Inpatient Hospital Stay (HOSPITAL_COMMUNITY): Payer: BC Managed Care – PPO

## 2020-08-15 ENCOUNTER — Inpatient Hospital Stay (HOSPITAL_COMMUNITY)
Admission: RE | Admit: 2020-08-15 | Discharge: 2020-08-16 | DRG: 269 | Disposition: A | Discharge status: Home / Self Care | Payer: BC Managed Care – PPO | Attending: Vascular Surgery | Admitting: Vascular Surgery

## 2020-08-15 ENCOUNTER — Ambulatory Visit (HOSPITAL_COMMUNITY)
Admission: RE | Admit: 2020-08-15 | Payer: BC Managed Care – PPO | Source: Home / Self Care | Admitting: Vascular Surgery

## 2020-08-15 ENCOUNTER — Encounter (HOSPITAL_COMMUNITY): Payer: Self-pay | Admitting: Vascular Surgery

## 2020-08-15 ENCOUNTER — Encounter (HOSPITAL_COMMUNITY)
Admission: RE | Disposition: A | Discharge status: Home / Self Care | Payer: Self-pay | Source: Home / Self Care | Attending: Vascular Surgery

## 2020-08-15 DIAGNOSIS — F329 Major depressive disorder, single episode, unspecified: Secondary | ICD-10-CM | POA: Diagnosis present

## 2020-08-15 DIAGNOSIS — F17210 Nicotine dependence, cigarettes, uncomplicated: Secondary | ICD-10-CM | POA: Diagnosis present

## 2020-08-15 DIAGNOSIS — I10 Essential (primary) hypertension: Secondary | ICD-10-CM | POA: Diagnosis present

## 2020-08-15 DIAGNOSIS — I714 Abdominal aortic aneurysm, without rupture, unspecified: Secondary | ICD-10-CM | POA: Diagnosis present

## 2020-08-15 DIAGNOSIS — R110 Nausea: Secondary | ICD-10-CM | POA: Diagnosis not present

## 2020-08-15 DIAGNOSIS — E785 Hyperlipidemia, unspecified: Secondary | ICD-10-CM | POA: Diagnosis present

## 2020-08-15 DIAGNOSIS — Z8679 Personal history of other diseases of the circulatory system: Secondary | ICD-10-CM

## 2020-08-15 DIAGNOSIS — Z20822 Contact with and (suspected) exposure to covid-19: Secondary | ICD-10-CM | POA: Diagnosis present

## 2020-08-15 HISTORY — PX: ULTRASOUND GUIDANCE FOR VASCULAR ACCESS: SHX6516

## 2020-08-15 HISTORY — PX: FEMORAL ARTERY EXPLORATION: SHX5160

## 2020-08-15 HISTORY — PX: ABDOMINAL AORTIC ENDOVASCULAR STENT GRAFT: SHX5707

## 2020-08-15 LAB — CBC
HCT: 35.5 % — ABNORMAL LOW (ref 39.0–52.0)
Hemoglobin: 11.8 g/dL — ABNORMAL LOW (ref 13.0–17.0)
MCH: 30.9 pg (ref 26.0–34.0)
MCHC: 33.2 g/dL (ref 30.0–36.0)
MCV: 92.9 fL (ref 80.0–100.0)
Platelets: UNDETERMINED 10*3/uL (ref 150–400)
RBC: 3.82 MIL/uL — ABNORMAL LOW (ref 4.22–5.81)
RDW: 12.9 % (ref 11.5–15.5)
WBC: 8.4 10*3/uL (ref 4.0–10.5)
nRBC: 0 % (ref 0.0–0.2)

## 2020-08-15 LAB — BASIC METABOLIC PANEL
Anion gap: 6 (ref 5–15)
BUN: 12 mg/dL (ref 8–23)
CO2: 23 mmol/L (ref 22–32)
Calcium: 8.3 mg/dL — ABNORMAL LOW (ref 8.9–10.3)
Chloride: 107 mmol/L (ref 98–111)
Creatinine, Ser: 1.08 mg/dL (ref 0.61–1.24)
GFR calc Af Amer: >60 mL/min (ref >60)
GFR calc non Af Amer: >60 mL/min (ref >60)
Glucose, Bld: 145 mg/dL — ABNORMAL HIGH (ref 70–99)
Potassium: 4.4 mmol/L (ref 3.5–5.1)
Sodium: 136 mmol/L (ref 135–145)

## 2020-08-15 LAB — PROTIME-INR
INR: 1.2 (ref 0.8–1.2)
Prothrombin Time: 14.8 seconds (ref 11.4–15.2)

## 2020-08-15 LAB — MAGNESIUM: Magnesium: 1.6 mg/dL — ABNORMAL LOW (ref 1.7–2.4)

## 2020-08-15 LAB — APTT: aPTT: 32 seconds (ref 24–36)

## 2020-08-15 LAB — ABO/RH: ABO/RH(D): O POS

## 2020-08-15 SURGERY — ENDOVASCULAR REPAIR/STENT GRAFT
Anesthesia: General

## 2020-08-15 SURGERY — INSERTION, ENDOVASCULAR STENT GRAFT, AORTA, ABDOMINAL
Anesthesia: General | Site: Groin | Laterality: Right

## 2020-08-15 MED ORDER — ALPRAZOLAM 0.25 MG PO TABS
0.2500 mg | ORAL_TABLET | Freq: Every day | ORAL | Status: DC | PRN
  Administered 2020-08-15: 0.25 mg via ORAL
  Filled 2020-08-15: qty 2

## 2020-08-15 MED ORDER — FENTANYL CITRATE (PF) 250 MCG/5ML IJ SOLN
INTRAMUSCULAR | Status: DC | PRN
Start: 2020-08-15 — End: 2020-08-15
  Administered 2020-08-15: 25 ug via INTRAVENOUS
  Administered 2020-08-15: 50 ug via INTRAVENOUS
  Administered 2020-08-15: 100 ug via INTRAVENOUS
  Administered 2020-08-15: 25 ug via INTRAVENOUS

## 2020-08-15 MED ORDER — CEFAZOLIN SODIUM-DEXTROSE 2-4 GM/100ML-% IV SOLN
INTRAVENOUS | Status: AC
  Filled 2020-08-15: qty 100

## 2020-08-15 MED ORDER — ROCURONIUM BROMIDE 10 MG/ML (PF) SYRINGE
PREFILLED_SYRINGE | INTRAVENOUS | Status: DC | PRN
  Administered 2020-08-15 (×4): 20 mg via INTRAVENOUS
  Administered 2020-08-15: 60 mg via INTRAVENOUS

## 2020-08-15 MED ORDER — OXYCODONE-ACETAMINOPHEN 5-325 MG PO TABS
1.0000 | ORAL_TABLET | ORAL | Status: DC | PRN
  Administered 2020-08-15: 1 via ORAL
  Filled 2020-08-15: qty 1

## 2020-08-15 MED ORDER — PHENOL 1.4 % MT LIQD: 1.0000 | OROMUCOSAL | Status: DC | PRN

## 2020-08-15 MED ORDER — CEFAZOLIN SODIUM-DEXTROSE 2-3 GM-%(50ML) IV SOLR
INTRAVENOUS | Status: DC | PRN
  Administered 2020-08-15: 2 g via INTRAVENOUS

## 2020-08-15 MED ORDER — CEFAZOLIN SODIUM-DEXTROSE 2-4 GM/100ML-% IV SOLN
2.0000 g | Freq: Three times a day (TID) | INTRAVENOUS | Status: AC
  Administered 2020-08-15 – 2020-08-16 (×2): 2 g via INTRAVENOUS
  Filled 2020-08-15 (×2): qty 100

## 2020-08-15 MED ORDER — PANTOPRAZOLE SODIUM 40 MG PO TBEC
40.0000 mg | DELAYED_RELEASE_TABLET | Freq: Every day | ORAL | Status: DC
  Administered 2020-08-15 – 2020-08-16 (×2): 40 mg via ORAL
  Filled 2020-08-15 (×2): qty 1

## 2020-08-15 MED ORDER — POTASSIUM CHLORIDE CRYS ER 20 MEQ PO TBCR: 20.0000 meq | EXTENDED_RELEASE_TABLET | Freq: Every day | ORAL | Status: DC | PRN

## 2020-08-15 MED ORDER — ACETAMINOPHEN 160 MG/5ML PO SOLN: 325.0000 mg | ORAL | Status: DC | PRN

## 2020-08-15 MED ORDER — SODIUM CHLORIDE 0.9 % IV SOLN: 500.0000 mL | Freq: Once | INTRAVENOUS | Status: DC | PRN

## 2020-08-15 MED ORDER — FINASTERIDE 5 MG PO TABS
5.0000 mg | ORAL_TABLET | Freq: Every day | ORAL | Status: DC
  Administered 2020-08-16: 5 mg via ORAL
  Filled 2020-08-15 (×2): qty 1

## 2020-08-15 MED ORDER — HYDRALAZINE HCL 20 MG/ML IJ SOLN: 5.0000 mg | INTRAMUSCULAR | Status: DC | PRN

## 2020-08-15 MED ORDER — METOPROLOL TARTRATE 5 MG/5ML IV SOLN: 2.0000 mg | INTRAVENOUS | Status: DC | PRN

## 2020-08-15 MED ORDER — ACETAMINOPHEN 325 MG PO TABS: 325.0000 mg | ORAL_TABLET | ORAL | Status: DC | PRN

## 2020-08-15 MED ORDER — ONDANSETRON HCL 4 MG/2ML IJ SOLN
4.0000 mg | Freq: Once | INTRAMUSCULAR | Status: AC | PRN
  Administered 2020-08-15: 4 mg via INTRAVENOUS

## 2020-08-15 MED ORDER — PROTAMINE SULFATE 10 MG/ML IV SOLN
INTRAVENOUS | Status: DC | PRN
  Administered 2020-08-15: 20 mg via INTRAVENOUS
  Administered 2020-08-15: 30 mg via INTRAVENOUS
  Administered 2020-08-15 (×3): 20 mg via INTRAVENOUS
  Administered 2020-08-15: 40 mg via INTRAVENOUS

## 2020-08-15 MED ORDER — HEPARIN SODIUM (PORCINE) 5000 UNIT/ML IJ SOLN
5000.0000 [IU] | Freq: Three times a day (TID) | INTRAMUSCULAR | Status: DC
  Administered 2020-08-16: 5000 [IU] via SUBCUTANEOUS
  Filled 2020-08-15: qty 1

## 2020-08-15 MED ORDER — LABETALOL HCL 5 MG/ML IV SOLN: 10.0000 mg | INTRAVENOUS | Status: DC | PRN

## 2020-08-15 MED ORDER — CHLORHEXIDINE GLUCONATE 0.12 % MT SOLN
OROMUCOSAL | Status: AC
  Administered 2020-08-15: 15 mL via OROMUCOSAL
  Filled 2020-08-15: qty 15

## 2020-08-15 MED ORDER — 0.9 % SODIUM CHLORIDE (POUR BTL) OPTIME
TOPICAL | Status: DC | PRN
  Administered 2020-08-15: 1000 mL

## 2020-08-15 MED ORDER — PROPOFOL 10 MG/ML IV BOLUS
INTRAVENOUS | Status: DC | PRN
  Administered 2020-08-15: 200 mg via INTRAVENOUS
  Administered 2020-08-15: 30 mg via INTRAVENOUS

## 2020-08-15 MED ORDER — EPHEDRINE SULFATE-NACL 50-0.9 MG/10ML-% IV SOSY
PREFILLED_SYRINGE | INTRAVENOUS | Status: DC | PRN
  Administered 2020-08-15: 10 mg via INTRAVENOUS

## 2020-08-15 MED ORDER — LACTATED RINGERS IV SOLN: INTRAVENOUS | Status: DC

## 2020-08-15 MED ORDER — ONDANSETRON HCL 4 MG/2ML IJ SOLN
INTRAMUSCULAR | Status: AC
  Filled 2020-08-15: qty 2

## 2020-08-15 MED ORDER — SUGAMMADEX SODIUM 200 MG/2ML IV SOLN
INTRAVENOUS | Status: DC | PRN
  Administered 2020-08-15: 300 mg via INTRAVENOUS

## 2020-08-15 MED ORDER — ESCITALOPRAM OXALATE 10 MG PO TABS
10.0000 mg | ORAL_TABLET | Freq: Every day | ORAL | Status: DC
  Administered 2020-08-16: 10 mg via ORAL
  Filled 2020-08-15 (×2): qty 1

## 2020-08-15 MED ORDER — DOCUSATE SODIUM 100 MG PO CAPS
100.0000 mg | ORAL_CAPSULE | Freq: Every day | ORAL | Status: DC
  Administered 2020-08-16: 100 mg via ORAL
  Filled 2020-08-15 (×2): qty 1

## 2020-08-15 MED ORDER — ASPIRIN EC 81 MG PO TBEC
81.0000 mg | DELAYED_RELEASE_TABLET | Freq: Every day | ORAL | Status: DC
  Administered 2020-08-15 – 2020-08-16 (×2): 81 mg via ORAL
  Filled 2020-08-15 (×2): qty 1

## 2020-08-15 MED ORDER — PHENYLEPHRINE HCL-NACL 10-0.9 MG/250ML-% IV SOLN
INTRAVENOUS | Status: DC | PRN
  Administered 2020-08-15: 15 ug/min via INTRAVENOUS

## 2020-08-15 MED ORDER — ROSUVASTATIN CALCIUM 20 MG PO TABS
20.0000 mg | ORAL_TABLET | Freq: Every day | ORAL | Status: DC
  Administered 2020-08-16: 20 mg via ORAL
  Filled 2020-08-15 (×2): qty 1

## 2020-08-15 MED ORDER — MORPHINE SULFATE (PF) 2 MG/ML IV SOLN: 2.0000 mg | INTRAVENOUS | Status: DC | PRN

## 2020-08-15 MED ORDER — MAGNESIUM SULFATE 2 GM/50ML IV SOLN: 2.0000 g | Freq: Every day | INTRAVENOUS | Status: DC | PRN

## 2020-08-15 MED ORDER — HEPARIN SODIUM (PORCINE) 1000 UNIT/ML IJ SOLN
INTRAMUSCULAR | Status: DC | PRN
  Administered 2020-08-15 (×2): 10000 [IU] via INTRAVENOUS

## 2020-08-15 MED ORDER — GUAIFENESIN-DM 100-10 MG/5ML PO SYRP: 15.0000 mL | ORAL_SOLUTION | ORAL | Status: DC | PRN

## 2020-08-15 MED ORDER — METOPROLOL SUCCINATE ER 25 MG PO TB24
25.0000 mg | ORAL_TABLET | Freq: Every day | ORAL | Status: DC
  Administered 2020-08-16: 25 mg via ORAL
  Filled 2020-08-15 (×2): qty 1

## 2020-08-15 MED ORDER — ACETAMINOPHEN 650 MG RE SUPP: 325.0000 mg | RECTAL | Status: DC | PRN

## 2020-08-15 MED ORDER — SODIUM CHLORIDE 0.9 % IV SOLN
INTRAVENOUS | Status: DC | PRN
  Administered 2020-08-15: 500 mL

## 2020-08-15 MED ORDER — ONDANSETRON HCL 4 MG/2ML IJ SOLN: 4.0000 mg | Freq: Four times a day (QID) | INTRAMUSCULAR | Status: DC | PRN

## 2020-08-15 MED ORDER — DEXAMETHASONE SODIUM PHOSPHATE 10 MG/ML IJ SOLN
INTRAMUSCULAR | Status: DC | PRN
  Administered 2020-08-15: 5 mg via INTRAVENOUS

## 2020-08-15 MED ORDER — FENTANYL CITRATE (PF) 100 MCG/2ML IJ SOLN
INTRAMUSCULAR | Status: AC
  Filled 2020-08-15: qty 2

## 2020-08-15 MED ORDER — CHLORHEXIDINE GLUCONATE 0.12 % MT SOLN: 15.0000 mL | Freq: Once | OROMUCOSAL | Status: AC

## 2020-08-15 MED ORDER — ALUM & MAG HYDROXIDE-SIMETH 200-200-20 MG/5ML PO SUSP: 15.0000 mL | ORAL | Status: DC | PRN

## 2020-08-15 MED ORDER — ONDANSETRON HCL 4 MG/2ML IJ SOLN
INTRAMUSCULAR | Status: DC | PRN
  Administered 2020-08-15: 4 mg via INTRAVENOUS

## 2020-08-15 MED ORDER — SODIUM CHLORIDE 0.9 % IV SOLN
INTRAVENOUS | Status: AC
  Filled 2020-08-15: qty 1.2

## 2020-08-15 MED ORDER — SODIUM CHLORIDE 0.9 % IV SOLN: INTRAVENOUS | Status: DC

## 2020-08-15 MED ORDER — ORAL CARE MOUTH RINSE: 15.0000 mL | Freq: Once | OROMUCOSAL | Status: AC

## 2020-08-15 MED ORDER — LACTATED RINGERS IV SOLN: INTRAVENOUS | Status: DC | PRN

## 2020-08-15 MED ORDER — HYDROMORPHONE HCL 1 MG/ML IJ SOLN: 0.2500 mg | INTRAMUSCULAR | Status: DC | PRN

## 2020-08-15 MED ORDER — IODIXANOL 320 MG/ML IV SOLN
INTRAVENOUS | Status: DC | PRN
  Administered 2020-08-15: 80 mL via INTRA_ARTERIAL
  Administered 2020-08-15: 30 mL via INTRA_ARTERIAL

## 2020-08-15 MED ORDER — LIDOCAINE 2% (20 MG/ML) 5 ML SYRINGE
INTRAMUSCULAR | Status: DC | PRN
  Administered 2020-08-15: 60 mg via INTRAVENOUS

## 2020-08-15 SURGICAL SUPPLY — 70 items
BLADE HEX COATED 2.75 (ELECTRODE) ×4 IMPLANT
CANISTER SUCT 3000ML PPV (MISCELLANEOUS) ×4 IMPLANT
CATH BEACON 5.038 65CM KMP-01 (CATHETERS) ×4 IMPLANT
CATH OMNI FLUSH .035X70CM (CATHETERS) ×4 IMPLANT
CLIP VESOCCLUDE MED 6/CT (CLIP) ×4 IMPLANT
CLIP VESOCCLUDE SM WIDE 6/CT (CLIP) ×4 IMPLANT
COVER SURGICAL LIGHT HANDLE (MISCELLANEOUS) ×4 IMPLANT
COVER WAND RF STERILE (DRAPES) IMPLANT
DERMABOND ADVANCED (GAUZE/BANDAGES/DRESSINGS) ×1
DERMABOND ADVANCED .7 DNX12 (GAUZE/BANDAGES/DRESSINGS) ×3 IMPLANT
DEVICE CLOSURE PERCLS PRGLD 6F (VASCULAR PRODUCTS) ×21 IMPLANT
DEVICE TORQUE KENDALL .025-038 (MISCELLANEOUS) ×4 IMPLANT
DRSG TEGADERM 2-3/8X2-3/4 SM (GAUZE/BANDAGES/DRESSINGS) ×8 IMPLANT
DRYSEAL FLEXSHEATH 12FR 33CM (SHEATH)
DRYSEAL FLEXSHEATH 16FR 33CM (SHEATH) ×1
DRYSEAL FLEXSHEATH 18FR 33CM (SHEATH) ×1
ELECT REM PT RETURN 9FT ADLT (ELECTROSURGICAL) ×8
ELECTRODE REM PT RTRN 9FT ADLT (ELECTROSURGICAL) ×6 IMPLANT
EXCLDR TRNK ENDO 32X14.5X14 18 (Endovascular Graft) ×4 IMPLANT
EXCLUDER TNK END 32X14.5X14 18 (Endovascular Graft) ×3 IMPLANT
GLOVE BIO SURGEON STRL SZ7.5 (GLOVE) ×4 IMPLANT
GOWN STRL REUS W/ TWL LRG LVL3 (GOWN DISPOSABLE) ×9 IMPLANT
GOWN STRL REUS W/TWL LRG LVL3 (GOWN DISPOSABLE) ×3
GRAFT BALLN CATH 65CM (STENTS) ×3 IMPLANT
GUIDEWIRE ANGLED .035X150CM (WIRE) ×4 IMPLANT
KIT BASIN OR (CUSTOM PROCEDURE TRAY) ×4 IMPLANT
KIT TURNOVER KIT B (KITS) ×4 IMPLANT
LEG CONTRALATERAL 16X14.5X14 (Vascular Products) ×4 IMPLANT
LEG CONTRALATERAL 27X12 (Vascular Products) ×4 IMPLANT
LEG CONTRALATERAL 27X14 (Vascular Products) ×4 IMPLANT
LOOP VESSEL MAXI BLUE (MISCELLANEOUS) ×8 IMPLANT
LOOP VESSEL MINI RED (MISCELLANEOUS) ×4 IMPLANT
NEEDLE PERC 18GX7CM (NEEDLE) IMPLANT
NS IRRIG 1000ML POUR BTL (IV SOLUTION) ×4 IMPLANT
PACK ENDOVASCULAR (PACKS) ×4 IMPLANT
PAD ARMBOARD 7.5X6 YLW CONV (MISCELLANEOUS) ×8 IMPLANT
PENCIL BUTTON HOLSTER BLD 10FT (ELECTRODE) ×4 IMPLANT
PERCLOSE PROGLIDE 6F (VASCULAR PRODUCTS) ×28
SET MICROPUNCTURE 5F STIFF (MISCELLANEOUS) ×4 IMPLANT
SHEATH BRITE TIP 8FR 23CM (SHEATH) ×4 IMPLANT
SHEATH DRYSEAL FLEX 12FR 33CM (SHEATH) IMPLANT
SHEATH DRYSEAL FLEX 16FR 33CM (SHEATH) ×3 IMPLANT
SHEATH DRYSEAL FLEX 18FR 33CM (SHEATH) ×3 IMPLANT
SHEATH FAST CATH 12F 12CM (SHEATH) ×4 IMPLANT
SHEATH PINNACLE 8F 10CM (SHEATH) ×4 IMPLANT
SPONGE LAP 18X18 RF (DISPOSABLE) ×4 IMPLANT
STENT GRAFT BALLN CATH 65CM (STENTS) ×1
STOPCOCK MORSE 400PSI 3WAY (MISCELLANEOUS) ×4 IMPLANT
SUT PROLENE 5 0 C 1 24 (SUTURE) ×20 IMPLANT
SUT PROLENE 5 0 C 1 36 (SUTURE) ×4 IMPLANT
SUT SILK 1 SH (SUTURE) ×4 IMPLANT
SUT SILK 1 TIES 10X30 (SUTURE) ×4 IMPLANT
SUT SILK 2 0 (SUTURE) ×1
SUT SILK 2-0 18XBRD TIE 12 (SUTURE) ×3 IMPLANT
SUT SILK 3 0 (SUTURE) ×1
SUT SILK 3-0 18XBRD TIE 12 (SUTURE) ×3 IMPLANT
SUT SILK 4 0 (SUTURE) ×1
SUT SILK 4-0 18XBRD TIE 12 (SUTURE) ×3 IMPLANT
SUT VIC AB 2-0 SH 27 (SUTURE) ×2
SUT VIC AB 2-0 SH 27X BRD (SUTURE) ×3 IMPLANT
SUT VIC AB 2-0 SH 27XBRD (SUTURE) ×3 IMPLANT
SUT VIC AB 3-0 SH 27 (SUTURE) ×3
SUT VIC AB 3-0 SH 27X BRD (SUTURE) ×9 IMPLANT
SUT VICRYL 4-0 PS2 18IN ABS (SUTURE) ×12 IMPLANT
SYR 20ML LL LF (SYRINGE) ×4 IMPLANT
TOWEL GREEN STERILE (TOWEL DISPOSABLE) ×4 IMPLANT
TRAY FOLEY MTR SLVR 16FR STAT (SET/KITS/TRAYS/PACK) ×4 IMPLANT
TUBING HIGH PRESSURE 120CM (CONNECTOR) ×4 IMPLANT
WIRE AMPLATZ SS-J .035X180CM (WIRE) ×8 IMPLANT
WIRE BENTSON .035X145CM (WIRE) ×8 IMPLANT

## 2020-08-15 NOTE — Transfer of Care (Signed)
Immediate Anesthesia Transfer of Care Note  Patient: Benjamin Preston  Procedure(s) Performed: ABDOMINAL AORTIC ENDOVASCULAR STENT GRAFT (N/A Abdomen) ULTRASOUND GUIDANCE FOR VASCULAR ACCESS (Bilateral Groin) RIGHT FEMORAL ARTERY EXPLORATION AND REPAIR (Right Groin)  Patient Location: PACU  Anesthesia Type:General  Level of Consciousness: awake and patient cooperative  Airway & Oxygen Therapy: Patient Spontanous Breathing and Patient connected to face mask oxygen  Post-op Assessment: Report given to RN and Post -op Vital signs reviewed and stable  Post vital signs: Reviewed and stable  Last Vitals:  Vitals Value Taken Time  BP    Temp    Pulse 77 08/15/20 1138  Resp 10 08/15/20 1138  SpO2 97 % 08/15/20 1138  Vitals shown include unvalidated device data.  Last Pain:  Vitals:   08/15/20 0559  TempSrc: Oral      Patients Stated Pain Goal: 3 (76/14/70 9295)  Complications: No complications documented.

## 2020-08-15 NOTE — Discharge Instructions (Signed)
°[facility]  ° °Discharge Instructions ° °Endovascular Aortic Aneurysm Repair ° °Please refer to the following instructions for your post-procedure care. Your surgeon or Physician Assistant will discuss any changes with you. ° °Activity ° °You are encouraged to walk as much as you can. You can slowly return to normal activities but must avoid strenuous activity and heavy lifting until your doctor tells you it's OK. Avoid activities such as vacuuming or swinging a gold club. It is normal to feel tired for several weeks after your surgery. Do not drive until your doctor gives the OK and you are no longer taking prescription pain medications. It is also normal to have difficulty with sleep habits, eating, and bowel movements after surgery. These will go away with time. ° °Bathing/Showering ° °Shower daily after you go home.  Do not soak in a bathtub, hot tub, or swim until the incision heals completely. ° °If you have incisions in your groin, wash the groin wounds with soap and water daily and pat dry. (No tub bath-only shower)  Then put a dry gauze or washcloth there to keep this area dry to help prevent wound infection daily and as needed.  Do not use Vaseline or neosporin on your incisions.  Only use soap and water on your incisions and then protect and keep dry. ° °Incision Care ° °Shower every day. Clean your incision with mild soap and water. Pat the area dry with a clean towel. You do not need a bandage unless otherwise instructed. Do not apply any ointments or creams to your incision. If you clothing is irritating, you may cover your incision with a dry gauze pad. ° °Wash the groin wound with soap and water daily and pat dry. (No tub bath-only shower)  Then put a dry gauze or washcloth there to keep this area dry daily and as needed.  Do not use Vaseline or neosporin on your incisions.  Only use soap and water on your incisions and then protect and keep dry. ° ° °Diet ° °Resume  your normal diet. There are no special food restrictions following this procedure. A low fat/low cholesterol diet is recommended for all patients with vascular disease. In order to heal from your surgery, it is CRITICAL to get adequate nutrition. Your body requires vitamins, minerals, and protein. Vegetables are the best source of vitamins and minerals. Vegetables also provide the perfect balance of protein. Processed food has little nutritional value, so try to avoid this. ° °Medications ° °Resume taking all of your medications unless your doctor or nurse practitioner tells you not to. If your incision is causing pain, you may take over-the-counter pain relievers such as acetaminophen (Tylenol). If you were prescribed a stronger pain medication, please be aware these medications can cause nausea and constipation. Prevent nausea by taking the medication with a snack or meal. Avoid constipation by drinking plenty of fluids and eating foods with a high amount of fiber, such as fruits, vegetables, and grains.  °Do not take Tylenol if you are taking prescription pain medications. ° ° °Follow up ° °Our office will schedule a follow-up appointment with a CT scan 3-4 weeks after your surgery. ° °Please call us immediately for any of the following conditions ° °• Severe or worsening pain in your legs or feet or in your abdomen back or chest. °• Increased pain, redness, drainage (pus) from your incision site. °• Increased abdominal pain, bloating, nausea, vomiting or persistent diarrhea. °• Fever of 101   degrees or higher. °• Swelling in your leg (s), °•  °Reduce your risk of vascular disease ° °•Stop smoking. If you would like help call [facility] at 1-800-QUIT-NOW (1-800-784-8669) or [facility] at 336-586-4000. °•Manage your cholesterol °•Maintain a desired weight °•Control your diabetes °•Keep your blood pressure down ° °If you have questions, please call the office at [phone]. ° °

## 2020-08-15 NOTE — Anesthesia Procedure Notes (Signed)
Arterial Line Insertion Start/End9/28/2021 7:15 AM, 08/15/2020 7:25 AM Performed by: Verdie Drown, CRNA, CRNA  Patient location: Pre-op. Preanesthetic checklist: patient identified, IV checked, risks and benefits discussed, surgical consent, monitors and equipment checked, pre-op evaluation and timeout performed Lidocaine 1% used for infiltration Right, radial was placed Catheter size: 20 G Hand hygiene performed  and maximum sterile barriers used  Allen's test indicative of satisfactory collateral circulation Attempts: 2 Procedure performed without using ultrasound guided technique. Following insertion, dressing applied and Biopatch. Post procedure assessment: normal  Patient tolerated the procedure well with no immediate complications.

## 2020-08-15 NOTE — Anesthesia Postprocedure Evaluation (Signed)
Anesthesia Post Note  Patient: Benjamin Preston  Procedure(s) Performed: ABDOMINAL AORTIC ENDOVASCULAR STENT GRAFT (N/A Abdomen) ULTRASOUND GUIDANCE FOR VASCULAR ACCESS (Bilateral Groin) RIGHT FEMORAL ARTERY EXPLORATION AND REPAIR (Right Groin)     Patient location during evaluation: PACU Anesthesia Type: General Level of consciousness: awake and alert Pain management: pain level controlled Vital Signs Assessment: post-procedure vital signs reviewed and stable Respiratory status: spontaneous breathing, nonlabored ventilation, respiratory function stable and patient connected to nasal cannula oxygen Cardiovascular status: blood pressure returned to baseline and stable Postop Assessment: no apparent nausea or vomiting Anesthetic complications: no   No complications documented.  Last Vitals:  Vitals:   08/15/20 1238 08/15/20 1240  BP: 112/78 131/77  Pulse: 69 66  Resp: 15   Temp: 36.8 C   SpO2: 99% 99%    Last Pain:  Vitals:   08/15/20 1238  TempSrc: Oral  PainSc:                  March Rummage Derek Huneycutt

## 2020-08-15 NOTE — Anesthesia Procedure Notes (Signed)
Procedure Name: Intubation Date/Time: 08/15/2020 7:50 AM Performed by: Myna Bright, CRNA Pre-anesthesia Checklist: Patient identified, Emergency Drugs available, Suction available and Patient being monitored Patient Re-evaluated:Patient Re-evaluated prior to induction Oxygen Delivery Method: Circle system utilized Preoxygenation: Pre-oxygenation with 100% oxygen Induction Type: IV induction Ventilation: Mask ventilation without difficulty and Oral airway inserted - appropriate to patient size Laryngoscope Size: Mac and 4 Grade View: Grade I Tube type: Oral Tube size: 7.5 mm Number of attempts: 1 Airway Equipment and Method: Stylet Placement Confirmation: ETT inserted through vocal cords under direct vision,  positive ETCO2 and breath sounds checked- equal and bilateral Secured at: 22 cm Tube secured with: Tape Dental Injury: Teeth and Oropharynx as per pre-operative assessment

## 2020-08-15 NOTE — H&P (Signed)
Referring Physician: Dr Marisue Humble  Patient name: Benjamin Preston  MRN: [id number]        DOB: 01-25-1956            Sex: male  REASON FOR CONSULT: Abdominal aortic aneurysm  HPI: Benjamin Preston is a 64 y.o. male, found to have a 7 cm infrarenal abdominal aortic aneurysm on CT scan recently done for hematuria.  Patient has no abdominal or back pain.  He has no family history of abdominal aortic aneurysm.  He does smoke about 1/4 pack of cigarettes per day.  He has a 40-year smoking history.  He is trying to quit.  He has never had any abdominal operations.  He gets short of breath with severe exertion.  However he can climb 2 flights of stairs without getting short of breath.  He has no chest pain.  He has no prior history of stroke.  Other medical problems include depression, hyperlipidemia hypertension all of which are currently stable.  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      Past Medical History:  Diagnosis Date  . Anxiety   . Depression   . Hyperlipidemia   . Hypertension    Past Surgical History:  Procedure Laterality Date  . TENDON GRAFT    . TONSILLECTOMY      History reviewed. No pertinent family history.  SOCIAL HISTORY: Social History        Socioeconomic History  . Marital status: Married    Spouse name: Not on file  . Number of children: Not on file  . Years of education: Not on file  . Highest education level: Not on file  Occupational History  . Not on file  Tobacco Use  . Smoking status: Current Every Day Smoker    Packs/day: 0.50  . Smokeless tobacco: Never Used  Vaping Use  . Vaping Use: Never used  Substance and Sexual Activity  . Alcohol use: Not Currently  . Drug use: Never  . Sexual activity: Not on file  Other Topics Concern  . Not on file  Social History Narrative  . Not on file   Social Determinants of Health      Financial Resource Strain:   . Difficulty  of Paying Living Expenses: Not on file  Food Insecurity:   . Worried About Charity fundraiser in the Last Year: Not on file  . Ran Out of Food in the Last Year: Not on file  Transportation Needs:   . Lack of Transportation (Medical): Not on file  . Lack of Transportation (Non-Medical): Not on file  Physical Activity:   . Days of Exercise per Week: Not on file  . Minutes of Exercise per Session: Not on file  Stress:   . Feeling of Stress : Not on file  Social Connections:   . Frequency of Communication with Friends and Family: Not on file  . Frequency of Social Gatherings with Friends and Family: Not on file  . Attends Religious Services: Not on file  . Active Member of Clubs or Organizations: Not on file  . Attends Archivist Meetings: Not on file  . Marital Status: Not on file  Intimate Partner Violence:   . Fear of Current or Ex-Partner: Not on file  . Emotionally Abused: Not on file  . Physically Abused: Not on file  . Sexually Abused: Not on file    No Known Allergies  Current Outpatient Medications  Medication Sig Dispense Refill  . ALPRAZolam (XANAX) 0.5 MG tablet TK 1/2 TO 1 T PO QD PRN    . escitalopram (LEXAPRO) 10 MG tablet Take 10 mg by mouth daily.    . finasteride (PROSCAR) 5 MG tablet Take 5 mg by mouth daily.    . metoprolol succinate (TOPROL-XL) 25 MG 24 hr tablet Take 25 mg by mouth daily.    . simvastatin (ZOCOR) 20 MG tablet Take 20 mg by mouth daily.     No current facility-administered medications for this visit.    ROS:   General:  No weight loss, Fever, chills  HEENT: No recent headaches, no nasal bleeding, no visual changes, no sore throat  Neurologic: No dizziness, blackouts, seizures. No recent symptoms of stroke or mini- stroke. No recent episodes of slurred speech, or temporary blindness.  Cardiac: No recent episodes of chest pain/pressure, no shortness of breath at rest.  No shortness of breath with  exertion.  Denies history of atrial fibrillation or irregular heartbeat  Vascular: No history of rest pain in feet.  No history of claudication.  No history of non-healing ulcer, No history of DVT   Pulmonary: No home oxygen, no productive cough, no hemoptysis,  No asthma or wheezing  Musculoskeletal:  [ ]  Arthritis, [ ]  Low back pain,  [ ]  Joint pain  Hematologic:No history of hypercoagulable state.  No history of easy bleeding.  No history of anemia  Gastrointestinal: No hematochezia or melena,  No gastroesophageal reflux, no trouble swallowing  Urinary: [ ]  chronic Kidney disease, [ ]  on HD - [ ]  MWF or [ ]  TTHS, [ ]  Burning with urination, [ ]  Frequent urination, [ ]  Difficulty urinating;   Skin: No rashes  Psychological: No history of anxiety,  No history of depression   Physical Examination   Vitals:   08/15/20 0559  BP: (!) 141/75  Pulse: 62  Resp: 17  Temp: 98.5 F (36.9 C)  TempSrc: Oral  SpO2: 98%  Weight: 106.1 kg  Height: 5\' 10"  (1.778 m)    General:  Alert and oriented, no acute distress HEENT: Normal Neck: No JVD Cardiac: Regular Rate and Rhythm without murmur Abdomen: Soft, non-tender, non-distended, slight epigastric mass consistent with 7 cm aneurysm nontender Skin: No rash Extremity Pulses:  2+ radial, brachial, femoral, dorsalis pedis, posterior tibial pulses bilaterally Musculoskeletal: No deformity or edema      Neurologic: Upper and lower extremity motor 5/5 and symmetric  DATA:  I reviewed the patient's CT scan of the abdomen and pelvis dated June 22, 2020.  Shows a 7 cm infrarenal abdominal aortic aneurysm with some neck tortuosity.  The neck diameter is about 28 mm.  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  He also has very tortuous iliacs.  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   Ectatic left iliac.  I discussed with him today the risks benefits possible complications of procedure details including but not limited to risk of rupture with no repair 5 to 10 %/year.  Risk of death with operation 1 to 2%.   PLAN: EVAR today  Ruta Hinds, MD [facility] Office: [phone]

## 2020-08-15 NOTE — Op Note (Addendum)
Procedure: Placement of Gore Excluder aneurysm stent graft for treatment of infrarenal abdominal aortic aneurysm  Preoperative diagnosis: Infrarenal abdominal aortic aneurysm and bilateral common iliac aneurysms  Postoperative diagnosis: Same  Anesthesia: General  Assistant: Arlee Muslim, PA-C to assist with wire manipulation exposure and expedite procedure.  Operative findings:   #1  Left side Pro-glide closure, right side femoral cutdown secondary to Pro-glide failure   #2 32x14x14 cm main body Gore Excluder device delivered via a right femoral system   #3 27 x 14 cm left iliac limb contralateral, with 27 x 12 extension   #4 14 x 14 ipsilateral iliac extension right    PROCEDURE DETAIL: After obtaining informed consent the patient was taken to the operating room. The patient was placed in supine position the operating room table. After induction of general anesthesia and endotracheal intubation a Foley catheter was placed. Next the patient was prepped and draped in usual sterile fashion from the nipples down to the knees. Ultrasound was used to identify the right common femoral artery as well as the femoral bifurcation. An 11 blade was used to make a small neck in the skin over the level of the right common femoral artery. An introducer needle was then used to cannulate the right common femoral artery without difficulty. A 0.035 Bentson wire was then threaded up into the abdominal aorta through the right femoral system. A short 9 French dilator was placed over the guidewire the right femoral system. This was used to dilate the tract.  There was some tortuosity in the right iliac system which made manipulation of the Proglide slightly difficult.  The dilator was then removed and a Proglide device inserted over the guidewire into the right femoral system and this was deployed at the 2:00 position. The Proglide device was then removed and an additional Proglide device was brought in operative field  and deployed at the 10:00 position. The sutures were kept separate and tagged with suture tags. Next the short 9 French sheath was then placed back over the guidewire into the right femoral system and the dilator removed and the sheath thoroughly flushed with heparinized saline. Attention was then turned to the left groin. Again using ultrasound the left common femoral artery was identified. The femoral bifurcation was also identified. A small nick was made in the skin with the 11 blade. A hemostat was used to dilate a tract down to the artery. An introducer needle was then used to cannulate the left common femoral artery without difficulty. A 0.035 Bentson wire was then threaded up into the abdominal aorta under fluoroscopic guidance. A 9 French dilator was then placed over the wire to dilate the tract. Two Proglide devices were again brought on operative field and these were fired sequentially in the 10:00 position followed by an additional Proglide at the 2:00 position. The Proglide delivery systems were removed and the long 9 French sheath placed back over the guidewire up to the level of the iliac of the aortic bifurcation.  At this point, a 5 [place] Omni flush catheter was placed over the guidewire and the right groin up into the abdominal aorta. An abdominal aortogram was obtained in the slight RAO and cranial position to determine level of the left and right renal arteries. At this point a 32 x 14 x 14 Gore Excluder main body device was selected. The Bentson wire from the right groin was advanced up into the descending thoracic aorta over a Kumpe catheter and the Bentsen wire replaced  with an 035 Amplatz wire.  The small sheath was swapped out for a 18 French dry seal sheath.  The main body device was then placed over the Amplatz wire in the right groin and advanced up to the level of the renal arteries. The top portion of the stent graft was then deployed with the end of the stent just below the level of  the left renal artery and this came over to just below the level of the right renal artery. The main body was delivered all the way down to the contralateral gate. Attention was then turned to the left groin. The Omni flush catheter was pulled down over a guidewire and removed and the Bentson wire placed in position to cannulate the contralateral gate.  A buddy wire technique with an Amplatz wire was also used to straighten the tortuosity.  The KMP catheter was placed back over the guidewire in the left femoral system. A 5 [place] KMP catheter was placed over this and this was used to selectively catheterize the contralateral gate and the guidewire was then advanced into the descending thoracic aorta. The main body portion of the gate was confirmed by twirling the pigtail catheter. The pigtail  catheter was then placed in a location so that we could use its markers to determine the exact length to the iliac bifurcation. An Amplatz wire was placed back through the pigtail catheter. A retrograde contrast angiogram was performed to determine the level of the left internal iliac artery and a 27 x 14 cm iliac limb was selected. The pigtail catheter was removed over the guidewire and the 27 x 14 cm limb advanced so there was full coverage of the long marker on the contralateral limb. This was then deployed in the usual fashion down to near the iliac bifurcation. The delivery system was then removed.  We then deployed an additional 27 x 12 limb to extend down to adjacent to the iliac bifurcation.  The remainder of the ipsilateral iliac limb was also deployed.  A retrograde contrast angiogram was also performed to make sure that the right iliac limb did not cover the right internal iliac artery.  Measurement was made of the right iliac bifurcation and a 14 x14 cm ipsilateral limb was placed.  This was extended past the iliac bifurcation.  The right internal iliac artery had been previously coil embolized for an internal  iliac artery aneurysm.  Attention was then turned back to the left iliac system and a Gore aortic balloon placed over the wire up to the level of the proximal end of the stent and this was ballooned to profile. The limb attachment site was also ballooned as well as the distal attachment site. Attention was then turned to the right groin and the balloon was advanced over the guidewire on the right side and the distal attachment site as well as the midportion of iliac limb were also ballooned. The 5 [place] Omni flush catheter was then placed back to the guidewire on the right side and a completion arteriogram was obtained. This showed no evidence of proximal or distal type I endoleak. There was possibly a type II endoleak but this was difficult to determine due to overlying bowel gas.. There is no significant filling of the aneurysm sac.  The left and right external/internal iliac artery was patent with no evidence of type I or type II endoleak at this point.  At this point the Omni flush catheter was removed over a guidewire. All delivery  devices were removed. We then proceeded to remove the large 18 French sheath from the right side with the guidewire in place. With pressure held above and below the insertion site the lateral Proglide closure was secured down.  An additional lateral Proglide was then placed over the guidewire and deployed.  We deployed 2 additional pro glides but were unable to achieve hemostasis.  Therefore an 60 French sheath was placed back over the guidewire to obtain hemostasis while we did a right femoral cutdown.  Prior to performing a cutdown on the right side I deployed the 2 pro glides on the left side and there was good hemostasis and the guidewire was removed.  The insertion site was closed with a Vicryl stitch.  There was good hemostasis.   I then made a longitudinal incision in the right groin carried this down through the subcutaneous tissues down the level of the right common  femoral artery at the level inguinal ligament.  A vessel loop was placed around this.  I then dissected out the common femoral artery below the sheath insertion site and a vessel loop was placed around this as well.  The patient was given a full dose of heparin.  The Vesseloops were snugged up and the sheath removed.  There was some slightly fractured intima anteriorly and this was debrided back.  The arteriotomy was then closed with interrupted 5-0 Prolene sutures.  Despite completion of the anastomosis it was for blood backbled and thoroughly flushed.  There was immediately a pulse in the right common femoral artery.  The patient had good Doppler flow to both feet at this point.  The patient had been given 10000 units of heparin before introducing the main body device.  He was given an additional 10,000 units prior to clamping the right common femoral artery.  This was fully reversed with 150 mg of protamine at the end of the case.  The right groin was then closed in multiple layers using running 2-0 and 3-0 Vicryl suture and a 4-0 Vicryl subcuticular stitch in the skin.  Dermabond was applied.  The patient tolerated procedure well and there were no complications. Instrument sponge and needle counts correct in the case. Patient was awakened in the operating room extubated and taken to the recovery room in stable condition.  Ruta Hinds, MD [facility] Office: [phone] Pager: [phone]

## 2020-08-15 NOTE — Progress Notes (Signed)
Mobility Specialist: Progress Note   08/15/20 1732  Mobility  Activity Ambulated in hall  Level of Assistance Independent after set-up  Assistive Device Front wheel walker  Distance Ambulated (ft) 470 ft  Mobility Response Tolerated well  Mobility performed by Mobility specialist  Bed Position Semi-fowlers  $Mobility charge 1 Mobility   Pre-Mobility: 77 HR, 98% SpO2 Post-Mobility: 78 HR, 115/65 BP, 98% SpO2  Pt had no c/o during ambulation. Pt is very motivated to walk.   [facility] Dayna Geurts Mobility Specialist

## 2020-08-15 NOTE — Progress Notes (Signed)
Foley catheter removed per pt request and MD order without difficulty.  Will continue to monitor.

## 2020-08-15 NOTE — Progress Notes (Addendum)
  Day of Surgery Note    Subjective:  Pt seen in recovery.  Nauseated.     Vitals:   08/15/20 1140 08/15/20 1155  BP: (!) 102/59 105/62  Pulse: 77 76  Resp: 12 10  Temp: (!) 97.3 F (36.3 C)   SpO2: 98% 97%    Incisions:   Right groin is clean without hematoma; left groin without hematoma Extremities:  1+ right DP/PT and 2+ left DP/PT Cardiac:  regular Lungs:  Non labored Abdomen:  Soft    Assessment/Plan:  This is a 64 y.o. male who is s/p  EVAR  -pt doing well in recovery.  Right groin with cutdown clean without hematoma.  Left groin soft without hematoma.   -palpable pedal pulses.   -nausea-just received zofran -to [place] later today   Leontine Locket, PA-C 08/15/2020 12:09 PM [phone]  Patient feeling okay minimal pain in incision feet well-perfused palpable pedal pulses, wishes to have Foley removed.  DC Foley catheter later today after he is off bed rest most likely discontinue to home tomorrow morning  Ruta Hinds, MD [facility] [place] Office: [phone]

## 2020-08-15 NOTE — Progress Notes (Signed)
Patient to room 4E23 from PACU. Vital signs obtained.On monitor CCMD notififed. CHG bath completed. Alert and oriented to room and call light. Call bell within reach.  Era Bumpers, RN

## 2020-08-16 ENCOUNTER — Encounter (HOSPITAL_COMMUNITY): Payer: Self-pay | Admitting: Vascular Surgery

## 2020-08-16 LAB — CBC
HCT: 33.8 % — ABNORMAL LOW (ref 39.0–52.0)
Hemoglobin: 11.3 g/dL — ABNORMAL LOW (ref 13.0–17.0)
MCH: 31.7 pg (ref 26.0–34.0)
MCHC: 33.4 g/dL (ref 30.0–36.0)
MCV: 94.9 fL (ref 80.0–100.0)
Platelets: 109 10*3/uL — ABNORMAL LOW (ref 150–400)
RBC: 3.56 MIL/uL — ABNORMAL LOW (ref 4.22–5.81)
RDW: 13.1 % (ref 11.5–15.5)
WBC: 10.4 10*3/uL (ref 4.0–10.5)
nRBC: 0 % (ref 0.0–0.2)

## 2020-08-16 LAB — BASIC METABOLIC PANEL
Anion gap: 8 (ref 5–15)
BUN: 16 mg/dL (ref 8–23)
CO2: 23 mmol/L (ref 22–32)
Calcium: 8.5 mg/dL — ABNORMAL LOW (ref 8.9–10.3)
Chloride: 107 mmol/L (ref 98–111)
Creatinine, Ser: 1.02 mg/dL (ref 0.61–1.24)
GFR calc Af Amer: >60 mL/min (ref >60)
GFR calc non Af Amer: >60 mL/min (ref >60)
Glucose, Bld: 108 mg/dL — ABNORMAL HIGH (ref 70–99)
Potassium: 4.5 mmol/L (ref 3.5–5.1)
Sodium: 138 mmol/L (ref 135–145)

## 2020-08-16 LAB — POCT ACTIVATED CLOTTING TIME: Activated Clotting Time: 263 seconds

## 2020-08-16 MED ORDER — OXYCODONE-ACETAMINOPHEN 5-325 MG PO TABS: 1.0000 | ORAL_TABLET | Freq: Four times a day (QID) | ORAL | 0 refills | Status: DC | PRN

## 2020-08-16 MED FILL — Fentanyl Citrate Preservative Free (PF) Inj 100 MCG/2ML: INTRAMUSCULAR | Qty: 4 | Status: AC

## 2020-08-16 NOTE — Discharge Summary (Signed)
EVAR Discharge Summary   Benjamin Preston [birth year] 64 y.o. male  MRN: [id number]  Admission Date: 08/15/2020  Discharge Date: 08/16/2020  Physician: Elam Dutch, MD  Admission Diagnosis: AAA (abdominal aortic aneurysm) without rupture [facility]) [I71.4]   HPI:   This is a 64 y.o. male found to have a 7 cm infrarenal abdominal aortic aneurysm on CT scan recently done for hematuria. Patient has no abdominal or back pain. He has no family history of abdominal aortic aneurysm. He does smoke about 1/4 pack of cigarettes per day. He has a 40-year smoking history. He is trying to quit. He has never had any abdominal operations. He gets short of breath with severe exertion. However he can climb 2 flights of stairs without getting short of breath. He has no chest pain. He has no prior history of stroke. Other medical problems includedepression, hyperlipidemia hypertension all of which are currently stable.  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  Hospital Course:  The patient was admitted to the hospital and taken to the operating room on 08/15/2020 and underwent: Placement of Gore Excluder aneurysm stent graft for treatment of infrarenal abdominal aortic aneurysm    Findings: #1  Left side Pro-glide closure, right side femoral cutdown secondary to Pro-glide failure #2 32x14x14 cm main body Gore Excluder device delivered via a right femoral system #3 27 x 14 cm left iliac limb contralateral, with 27 x 12 extension      #4 14 x 14 ipsilateral iliac extension right  The pt tolerated the procedure well and was transported to the PACU in good condition.  Pt did have some nausea in the PACU.  By POD 1, pt doing well with palpable pedal pulses.  Incision looks fine and left groin soft.  He did have some nausea earlier in the am but this got better.    The remainder of the hospital course consisted of increasing  mobilization and increasing intake of solids without difficulty.  CBC    Component Value Date/Time   WBC 10.4 08/16/2020 0406   RBC 3.56 (L) 08/16/2020 0406   HGB 11.3 (L) 08/16/2020 0406   HCT 33.8 (L) 08/16/2020 0406   PLT 109 (L) 08/16/2020 0406   MCV 94.9 08/16/2020 0406   MCH 31.7 08/16/2020 0406   MCHC 33.4 08/16/2020 0406   RDW 13.1 08/16/2020 0406    BMET    Component Value Date/Time   NA 138 08/16/2020 0406   K 4.5 08/16/2020 0406   CL 107 08/16/2020 0406   CO2 23 08/16/2020 0406   GLUCOSE 108 (H) 08/16/2020 0406   BUN 16 08/16/2020 0406   CREATININE 1.02 08/16/2020 0406   CALCIUM 8.5 (L) 08/16/2020 0406   GFRNONAA >60 08/16/2020 0406   GFRAA >60 08/16/2020 0406       Discharge Instructions    Discharge patient   Complete by: As directed    Dc pt after Dr. Oneida Alar has seen pt.   Discharge disposition: 01-Home or Self Care   Discharge patient date: 08/15/2020      Discharge Diagnosis:  AAA (abdominal aortic aneurysm) without rupture [facility]) [I71.4]  Secondary Diagnosis: Patient Active Problem List   Diagnosis Date Noted  . AAA (abdominal aortic aneurysm) without rupture ([place]) 08/15/2020   Past Medical History:  Diagnosis Date  . AAA (abdominal aortic aneurysm) ([place])   . Adenomatous colon polyp   . Anxiety   . Depression   . GERD (gastroesophageal reflux  disease)   . Headache, cluster, episodic   . Hematuria   . Hypercholesterolemia   . Hyperlipidemia   . Hypertension   . Recovering alcoholic ([place])      Allergies as of 08/16/2020   No Known Allergies     Medication List    TAKE these medications   ALPRAZolam 0.5 MG tablet Commonly known as: XANAX Take 0.25-0.5 mg by mouth daily as needed for anxiety.   aspirin EC 81 MG tablet Take 1 tablet (81 mg total) by mouth daily. Swallow whole.   aspirin-sod bicarb-citric acid 325 MG Tbef tablet Commonly known as: ALKA-SELTZER Take 325 mg by mouth every 6 (six) hours as needed  (indigestion/heartburn/upset stomach).   calcium carbonate 500 MG chewable tablet Commonly known as: TUMS - dosed in mg elemental calcium Chew 2-3 tablets by mouth 3 (three) times daily as needed for indigestion or heartburn.   escitalopram 10 MG tablet Commonly known as: LEXAPRO Take 10 mg by mouth daily.   finasteride 5 MG tablet Commonly known as: PROSCAR Take 5 mg by mouth daily.   ibuprofen 200 MG tablet Commonly known as: ADVIL Take 200-400 mg by mouth every 6 (six) hours as needed (pain.).   metoprolol succinate 25 MG 24 hr tablet Commonly known as: TOPROL-XL Take 25 mg by mouth daily.   nicotine 10 MG inhaler Commonly known as: NICOTROL Inhale 1 continuous puffing into the lungs as needed for smoking cessation.   oxyCODONE-acetaminophen 5-325 MG tablet Commonly known as: Percocet Take 1 tablet by mouth every 6 (six) hours as needed for severe pain.   rosuvastatin 20 MG tablet Commonly known as: CRESTOR Take 1 tablet (20 mg total) by mouth daily.       Discharge Instructions:  [facility]  Discharge Instructions Endovascular Aortic Aneurysm Repair  Please refer to the following instructions for your post-procedure care. Your surgeon or Physician Assistant will discuss any changes with you.  Activity  You are encouraged to walk as much as you can. You can slowly return to normal activities but must avoid strenuous activity and heavy lifting until your doctor tells you it's OK. Avoid activities such as vacuuming or swinging a gold club. It is normal to feel tired for several weeks after your surgery. Do not drive until your doctor gives the OK and you are no longer taking prescription pain medications. It is also normal to have difficulty with sleep habits, eating, and bowel movements after surgery. These will go away with time.  Bathing/Showering  You may shower after you go home. If you have an incision, do not soak in a  bathtub, hot tub, or swim until the incision heals completely.  Incision Care  Shower every day. Clean your incision with mild soap and water. Pat the area dry with a clean towel. You do not need a bandage unless otherwise instructed. Do not apply any ointments or creams to your incision. If you clothing is irritating, you may cover your incision with a dry gauze pad.  Diet  Resume your normal diet. There are no special food restrictions following this procedure. A low fat/low cholesterol diet is recommended for all patients with vascular disease. In order to heal from your surgery, it is CRITICAL to get adequate nutrition. Your body requires vitamins, minerals, and protein. Vegetables are the best source of vitamins and minerals. Vegetables also provide the perfect balance of protein. Processed food has little nutritional value, so try to avoid this.  Medications  Resume taking all of your medications unless your doctor or Physician Assistnat tells you not to. If your incision is causing pain, you may take over-the-counter pain relievers such as acetaminophen (Tylenol). If you were prescribed a stronger pain medication, please be aware these medications can cause nausea and constipation. Prevent nausea by taking the medication with a snack or meal. Avoid constipation by drinking plenty of fluids and eating foods with a high amount of fiber, such as fruits, vegetables, and grains.  Do not take Tylenol if you are taking prescription pain medications.   Follow up  [place] office will schedule a follow-up appointment with a C.T. scan 3-4 weeks after your surgery.  Please call us immediately for any of the following conditions  . Severe or worsening pain in your legs or feet or in your abdomen back or chest. . Increased pain, redness, drainage (pus) from your incision sit. . Increased abdominal pain, bloating, nausea, vomiting or persistent diarrhea. . Fever of 101 degrees or higher. . Swelling in  your leg (s), .  Reduce your risk of vascular disease  .Stop smoking. If you would like help call [facility] at 1-800-QUIT-NOW 802-504-6398) or [place] at 819-347-3638. .Manage your cholesterol .Maintain a desired weight .Control your diabetes .Keep your blood pressure down  If you have questions, please call the office at [phone].   Prescriptions given: 1.  Roxicet #15 No Refill  Disposition: home  Patient's condition: is Good  Follow up: 1. Dr. Oneida Alar in 4 weeks with CTA protocol   Leontine Locket, PA-C [facility] [phone] 08/16/2020  7:38 AM   - For VQI Registry use - Post-op:  Time to Extubation: [x]  In OR, [ ]  < 12 hrs, [ ]  12-24 hrs, [ ]  >=24 hrs Vasopressors Req. Post-op: No MI: No., [ ]  Troponin only, [ ]  EKG or Clinical New Arrhythmia: No CHF: No ICU Stay: 1 day in stepdown Transfusion: No     If yes, n/a units given  Complications: Resp failure: No., [ ]  Pneumonia, [ ]  Ventilator Chg in renal function: No., [ ]  Inc. Cr > 0.5, [ ]  Temp. Dialysis,  [ ]  Permanent dialysis Leg ischemia: No., no Surgery needed, [ ]  Yes, Surgery needed,  [ ]  Amputation Bowel ischemia: No., [ ]  Medical Rx, [ ]  Surgical Rx Wound complication: No., [ ]  Superficial separation/infection, [ ]  Return to OR Return to OR: No  Return to OR for bleeding: No Stroke: No., [ ]  Minor, [ ]  Major  Discharge medications: Statin use:  Yes  ASA use:  Yes  Plavix use:  No  Beta blocker use:  Yes  ARB use:  No ACEI use:  No CCB use:  No

## 2020-08-16 NOTE — Progress Notes (Signed)
Discharge instructions provided to patient. All medications, follow up appointments, and discharge instructions provided. IV out. Monitor off CCMD notified. Discharging home with wife.  Pavel Gadd R Kieren Adkison, RN  

## 2020-08-16 NOTE — Progress Notes (Addendum)
  Progress Note    08/16/2020 7:28 AM 1 Day Post-Op  Subjective:  Says he felt a little woozy this morning.  Throat is dry. He has walked and voided.  Nausea some better.  Felt a little nausea earlier this am.  Afebrile HR 50's-80's NSR 009'Q-330'Q systolic 762% RA  Vitals:   08/15/20 2351 08/16/20 0409  BP: 104/73 (!) 141/86  Pulse: 70 70  Resp: 18 18  Temp: 98.4 F (36.9 C) 98 F (36.7 C)  SpO2: 97% 100%    Physical Exam: Cardiac:  regular Lungs:  Non labored Incisions:  Right groin incision is clean and dry; left groin without hematoma Extremities:  Easily palpable bilateral PT pulses Abdomen:  Soft, NT  CBC    Component Value Date/Time   WBC 10.4 08/16/2020 0406   RBC 3.56 (L) 08/16/2020 0406   HGB 11.3 (L) 08/16/2020 0406   HCT 33.8 (L) 08/16/2020 0406   PLT 109 (L) 08/16/2020 0406   MCV 94.9 08/16/2020 0406   MCH 31.7 08/16/2020 0406   MCHC 33.4 08/16/2020 0406   RDW 13.1 08/16/2020 0406    BMET    Component Value Date/Time   NA 138 08/16/2020 0406   K 4.5 08/16/2020 0406   CL 107 08/16/2020 0406   CO2 23 08/16/2020 0406   GLUCOSE 108 (H) 08/16/2020 0406   BUN 16 08/16/2020 0406   CREATININE 1.02 08/16/2020 0406   CALCIUM 8.5 (L) 08/16/2020 0406   GFRNONAA >60 08/16/2020 0406   GFRAA >60 08/16/2020 0406    INR    Component Value Date/Time   INR 1.2 08/15/2020 1300     Intake/Output Summary (Last 24 hours) at 08/16/2020 0728 Last data filed at 08/16/2020 0421 Gross per 24 hour  Intake 1850 ml  Output 2395 ml  Net -545 ml     Assessment:  64 y.o. male is s/p:  EVAR  1 Day Post-Op  Plan: -pt doing well this am; he has palpable PT pulses bilaterally. -right groin incision looks good and left groin without hematoma -pt has voided and walked  -f/u with Dr. Oneida Alar in 4 weeks with CTA  -discussed groin wound care with pt -PDMP reviewed.   Leontine Locket, PA-C [facility] [phone] 08/16/2020 7:28 AM  Agree  with above.  Feet pink warm incisions clean pain controlled.  Ambulatory D/c home  Ruta Hinds, MD [facility] [place] Office: [phone]

## 2020-08-17 ENCOUNTER — Other Ambulatory Visit: Payer: Self-pay

## 2020-08-17 DIAGNOSIS — I714 Abdominal aortic aneurysm, without rupture, unspecified: Secondary | ICD-10-CM

## 2020-09-06 ENCOUNTER — Ambulatory Visit
Admission: RE | Admit: 2020-09-06 | Discharge: 2020-09-06 | Disposition: A | Discharge status: Home / Self Care | Payer: BC Managed Care – PPO | Source: Ambulatory Visit | Attending: Vascular Surgery | Admitting: Vascular Surgery

## 2020-09-06 DIAGNOSIS — I714 Abdominal aortic aneurysm, without rupture, unspecified: Secondary | ICD-10-CM

## 2020-09-06 MED ORDER — IOPAMIDOL (ISOVUE-370) INJECTION 76%
75.0000 mL | Freq: Once | INTRAVENOUS | Status: AC | PRN
  Administered 2020-09-06: 75 mL via INTRAVENOUS

## 2020-09-14 ENCOUNTER — Other Ambulatory Visit: Payer: Self-pay

## 2020-09-14 ENCOUNTER — Ambulatory Visit (INDEPENDENT_AMBULATORY_CARE_PROVIDER_SITE_OTHER): Payer: BC Managed Care – PPO | Admitting: Vascular Surgery

## 2020-09-14 ENCOUNTER — Encounter: Payer: Self-pay | Admitting: Vascular Surgery

## 2020-09-14 VITALS — BP 159/99 | HR 62 | Temp 98.3°F | Resp 20 | Ht 70.0 in | Wt 241.9 lb

## 2020-09-14 DIAGNOSIS — I714 Abdominal aortic aneurysm, without rupture, unspecified: Secondary | ICD-10-CM

## 2020-09-14 NOTE — Progress Notes (Signed)
Patient is a 64 year old male who returns for postoperative follow-up today.  He underwent coil embolization of a right internal iliac artery aneurysm on July 21, 2020.  He subsequently underwent Gore Excluder stent graft repair of an infrarenal and right common iliac aneurysm with coverage of the right internal iliac artery aneurysm..  This required a right femoral cutdown.  Left side was done percutaneously.  He has some redness around the incision currently but it is not draining.  It is fairly mild.  He has no fever or chills.  Physical exam:  Vitals:   09/14/20 0836  BP: (!) 159/99  Pulse: 62  Resp: 20  Temp: 98.3 F (36.8 C)  SpO2: 97%  Weight: 241 lb 14.4 oz (109.7 kg)  Height: 5\' 10"  (1.778 m)    Extremities: 2+ dorsalis pedis pulses bilaterally  Right groin incision healing mild erythema no fluctuance no drainage left groin incision no mass well-healed  Data: Patient had a CT scan of the abdomen and pelvis on September 06, 2020 which showed good exclusion of the aneurysm.  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  Right internal iliac artery aneurysm measures 3.7 cm.  Right common iliac artery 2.9 cm.  Assessment: Status post repair of infrarenal right common and right internal iliac artery aneurysms.  Doing well overall.  Type II endoleak observation long-term.  Imaging and findings and discussions held with the patient and his wife regarding repair and long-term maintenance and follow-up.  Plan: Patient will follow up with a CT angio of the abdomen and pelvis with office visit with me in 6 months.  Ruta Hinds, MD [facility] [place] Office: [phone]

## 2020-09-20 ENCOUNTER — Telehealth: Payer: Self-pay

## 2020-09-20 NOTE — Telephone Encounter (Signed)
Dental office called to ask if epi would affect the EVAR we performed on 08/15/20. Discussed with Kateri Plummer PA, and the local injections of epi should not be detrimental. Also that if patient needed to come off ASA for a few days prior to procedure that was ok as well. Dental office confirmed understanding.

## 2020-10-06 ENCOUNTER — Ambulatory Visit: Payer: BC Managed Care – PPO

## 2020-10-08 NOTE — Progress Notes (Signed)
Cardiology Office Note   Date:  10/09/2020   ID:  Benjamin Preston, DOB 1956/10/01, MRN [id number]  PCP:  Gaynelle Arabian, MD    No chief complaint on file.  RF for CAD  Wt Readings from Last 3 Encounters:  10/09/20 231 lb 12.8 oz (105.1 kg)  09/14/20 241 lb 14.4 oz (109.7 kg)  08/15/20 234 lb (106.1 kg)       History of Present Illness: Benjamin Preston is a 64 y.o. male with a history of hypertension.  I first saw him in August 2021 when a 7 cm abdominal aortic aneurysm was detected on the CT scan.  He was managed by Dr. Oneida Alar.  He was cleared for his aneurysm repair since he was active without cardiac symptoms.  In September 2021, he had successful endovascular repair.  He has had difficulty stopping smoking in the past.    He has done well since the AAA procedure.  He has had some blood in urine.  He has some enlargement of the prostate and is being treated with medication. This makes him anxious.   Denies : Chest pain. Dizziness. Leg edema. Nitroglycerin use. Orthopnea. Palpitations. Paroxysmal nocturnal dyspnea. Shortness of breath. Syncope.   He works a lot in his yard.  He walks some as well without any CP or SHOB.  He walks up stairs without any problems.  He has cut back almost completely with smoking.  He has been eating less.      Past Medical History:  Diagnosis Date  . AAA (abdominal aortic aneurysm) ([place])   . Adenomatous colon polyp   . Anxiety   . Depression   . GERD (gastroesophageal reflux disease)   . Headache, cluster, episodic   . Hematuria   . Hypercholesterolemia   . Hyperlipidemia   . Hypertension   . Recovering alcoholic [facility])     Past Surgical History:  Procedure Laterality Date  . ABDOMINAL AORTIC ENDOVASCULAR STENT GRAFT N/A 08/15/2020   Procedure: ABDOMINAL AORTIC ENDOVASCULAR STENT GRAFT;  Surgeon: Elam Dutch, MD;  Location: [place];  Service: Vascular;  Laterality: N/A;  . EMBOLIZATION Right 07/21/2020   Procedure: EMBOLIZATION;   Surgeon: Elam Dutch, MD;  Location: [place] CV LAB;  Service: Cardiovascular;  Laterality: Right;  external iliac  . FEMORAL ARTERY EXPLORATION Right 08/15/2020   Procedure: RIGHT FEMORAL ARTERY EXPLORATION AND REPAIR;  Surgeon: Elam Dutch, MD;  Location[facility];  Service: Vascular;  Laterality: Right;  . TENDON GRAFT    . TONSILLECTOMY    . ULTRASOUND GUIDANCE FOR VASCULAR ACCESS Bilateral 08/15/2020   Procedure: ULTRASOUND GUIDANCE FOR VASCULAR ACCESS;  Surgeon: Elam Dutch, MD;  Location: [facility] OR;  Service: Vascular;  Laterality: Bilateral;     Current Outpatient Medications  Medication Sig Dispense Refill  . ALPRAZolam (XANAX) 0.5 MG tablet Take 0.25-0.5 mg by mouth daily as needed for anxiety.     Marland Kitchen aspirin EC 81 MG tablet Take 1 tablet (81 mg total) by mouth daily. Swallow whole. 90 tablet 3  . aspirin-sod bicarb-citric acid (ALKA-SELTZER) 325 MG TBEF tablet Take 325 mg by mouth every 6 (six) hours as needed (indigestion/heartburn/upset stomach).    . calcium carbonate (TUMS - DOSED IN MG ELEMENTAL CALCIUM) 500 MG chewable tablet Chew 2-3 tablets by mouth 3 (three) times daily as needed for indigestion or heartburn.    . escitalopram (LEXAPRO) 10 MG tablet Take 10 mg by mouth daily.    . finasteride (PROSCAR) 5 MG  tablet Take 5 mg by mouth daily.    Marland Kitchen ibuprofen (ADVIL) 200 MG tablet Take 200-400 mg by mouth every 6 (six) hours as needed (pain.).    Marland Kitchen metoprolol succinate (TOPROL-XL) 25 MG 24 hr tablet Take 25 mg by mouth daily.    . nicotine (NICOTROL) 10 MG inhaler Inhale 1 continuous puffing into the lungs as needed for smoking cessation.    Marland Kitchen oxyCODONE-acetaminophen (PERCOCET) 5-325 MG tablet Take 1 tablet by mouth every 6 (six) hours as needed for severe pain. 15 tablet 0  . rosuvastatin (CRESTOR) 20 MG tablet Take 1 tablet (20 mg total) by mouth daily. 90 tablet 3   No current facility-administered medications for this visit.    Allergies:   Patient has no  known allergies.    Social History:  The patient  reports that he has been smoking cigarettes. He has been smoking about 0.25 packs per day. He has never used smokeless tobacco. He reports previous alcohol use. He reports that he does not use drugs.   Family History:  The patient's family history includes Diverticulitis in his mother; Mesothelioma in his mother; Prostate cancer in his father.    ROS:  Please see the history of present illness.   Otherwise, review of systems are positive for weight loss with stress.   All other systems are reviewed and negative.    PHYSICAL EXAM: VS:  BP 140/90   Pulse 80   Ht 5\' 10"  (1.778 m)   Wt 231 lb 12.8 oz (105.1 kg)   SpO2 94%   BMI 33.26 kg/m  , BMI Body mass index is 33.26 kg/m. GEN: Well nourished, well developed, in no acute distress  HEENT: normal  Neck: no JVD, carotid bruits, or masses Cardiac: RRR; no murmurs, rubs, or gallops,no edema  Respiratory:  clear to auscultation bilaterally, normal work of breathing GI: soft, nontender, nondistended, + BS MS: no deformity or atrophy  Skin: warm and dry, no rash Neuro:  Strength and sensation are intact Psych: euthymic mood, full affect   EKG:   The ekg ordered in 8/21 demonstrates NSR, no ST changes   Recent Labs: 08/14/2020: ALT 27 08/15/2020: Magnesium 1.6 08/16/2020: BUN 16; Creatinine, Ser 1.02; Hemoglobin 11.3; Platelets 109; Potassium 4.5; Sodium 138   Lipid Panel No results found for: CHOL, TRIG, HDL, CHOLHDL, VLDL, LDLCALC, LDLDIRECT   Other studies Reviewed: Additional studies/ records that were reviewed today with results demonstrating: hospital records reviewed.   ASSESSMENT AND PLAN:  1. Hyperlipidemia: Statin was changed to rosuvastatin in 2021 to increase intensity.  Lipids will be checked with PMD in 2/22. 2. Hypertension: Consider ACE inhibitor if his blood pressure remained high.  This would be a good choice given his peripheral arterial disease. 3. Tobacco  abuse: Nearly quit.  Using a supplement.   4. Aortic atherosclerosis/AAA repair: Continue statin.  Whole food, plant-based diet recommended.   5. He has some anxiety with his routine CT scans.  We discussed cardiac testing to screen for CAD but will hold off if he has any change in exercise tolerance or sx.     Current medicines are reviewed at length with the patient today.  The patient concerns regarding his medicines were addressed.  The following changes have been made:  No change  Labs/ tests ordered today include:  No orders of the defined types were placed in this encounter.   Recommend 150 minutes/week of aerobic exercise Low fat, low carb, high fiber diet recommended  Disposition:  FU in 1 year   Signed, Larae Grooms, MD  10/09/2020 8:50 AM    [facility] [address], [place], [place]  [address] Phone: [phone]; Fax: [phone]

## 2020-10-09 ENCOUNTER — Ambulatory Visit: Payer: BC Managed Care – PPO | Admitting: Interventional Cardiology

## 2020-10-09 ENCOUNTER — Encounter: Payer: Self-pay | Admitting: Interventional Cardiology

## 2020-10-09 ENCOUNTER — Other Ambulatory Visit: Payer: Self-pay

## 2020-10-09 VITALS — BP 140/90 | HR 80 | Ht 70.0 in | Wt 231.8 lb

## 2020-10-09 DIAGNOSIS — E782 Mixed hyperlipidemia: Secondary | ICD-10-CM | POA: Diagnosis not present

## 2020-10-09 DIAGNOSIS — Z720 Tobacco use: Secondary | ICD-10-CM

## 2020-10-09 DIAGNOSIS — I10 Essential (primary) hypertension: Secondary | ICD-10-CM | POA: Diagnosis not present

## 2020-10-09 DIAGNOSIS — I700 Atherosclerosis of aorta: Secondary | ICD-10-CM | POA: Diagnosis not present

## 2020-10-09 NOTE — Patient Instructions (Signed)
Medication Instructions:  Your physician recommends that you continue on your current medications as directed. Please refer to the Current Medication list given to you today.  *If you need a refill on your cardiac medications before your next appointment, please call your pharmacy*   Lab Work: None  If you have labs (blood work) drawn today and your tests are completely normal, you will receive your results only by: . MyChart Message (if you have MyChart) OR . A paper copy in the mail If you have any lab test that is abnormal or we need to change your treatment, we will call you to review the results.   Testing/Procedures: None  Follow-Up: At [facility], you and your health needs are our priority.  As part of our continuing mission to provide you with exceptional heart care, we have created designated Provider Care Teams.  These Care Teams include your primary Cardiologist (physician) and Advanced Practice Providers (APPs -  Physician Assistants and Nurse Practitioners) who all work together to provide you with the care you need, when you need it.  We recommend signing up for the patient portal called "MyChart".  Sign up information is provided on this After Visit Summary.  MyChart is used to connect with patients for Virtual Visits (Telemedicine).  Patients are able to view lab/test results, encounter notes, upcoming appointments, etc.  Non-urgent messages can be sent to your provider as well.   To learn more about what you can do with MyChart, go to [email or web address].    Your next appointment:   12 month(s)  The format for your next appointment:   In Person  Provider:   You may see Jayadeep Varanasi, MD or one of the following Advanced Practice Providers on your designated Care Team:    Dayna Dunn, PA-C  Michele Lenze, PA-C    Other Instructions  High-Fiber Diet Fiber, also called dietary fiber, is a type of carbohydrate that is found in fruits, vegetables, whole  grains, and beans. A high-fiber diet can have many health benefits. Your health care provider may recommend a high-fiber diet to help:  Prevent constipation. Fiber can make your bowel movements more regular.  Lower your cholesterol.  Relieve the following conditions: ? Swelling of veins in the anus (hemorrhoids). ? Swelling and irritation (inflammation) of specific areas of the digestive tract (uncomplicated diverticulosis). ? A problem of the large intestine (colon) that sometimes causes pain and diarrhea (irritable bowel syndrome, IBS).  Prevent overeating as part of a weight-loss plan.  Prevent heart disease, type 2 diabetes, and certain cancers. What is my plan? The recommended daily fiber intake in grams (g) includes:  38 g for men age 50 or younger.  30 g for men over age 50.  25 g for women age 50 or younger.  21 g for women over age 50. You can get the recommended daily intake of dietary fiber by:  Eating a variety of fruits, vegetables, grains, and beans.  Taking a fiber supplement, if it is not possible to get enough fiber through your diet. What do I need to know about a high-fiber diet?  It is better to get fiber through food sources rather than from fiber supplements. There is not a lot of research about how effective supplements are.  Always check the fiber content on the nutrition facts label of any prepackaged food. Look for foods that contain 5 g of fiber or more per serving.  Talk with a diet and nutrition specialist (dietitian) if you   have questions about specific foods that are recommended or not recommended for your medical condition, especially if those foods are not listed below.  Gradually increase how much fiber you consume. If you increase your intake of dietary fiber too quickly, you may have bloating, cramping, or gas.  Drink plenty of water. Water helps you to digest fiber. What are tips for following this plan?  Eat a wide variety of high-fiber  foods.  Make sure that half of the grains that you eat each day are whole grains.  Eat breads and cereals that are made with whole-grain flour instead of refined flour or white flour.  Eat brown rice, bulgur wheat, or millet instead of white rice.  Start the day with a breakfast that is high in fiber, such as a cereal that contains 5 g of fiber or more per serving.  Use beans in place of meat in soups, salads, and pasta dishes.  Eat high-fiber snacks, such as berries, raw vegetables, nuts, and popcorn.  Choose whole fruits and vegetables instead of processed forms like juice or sauce. What foods can I eat?  Fruits Berries. Pears. Apples. Oranges. Avocado. Prunes and raisins. Dried figs. Vegetables Sweet potatoes. Spinach. Kale. Artichokes. Cabbage. Broccoli. Cauliflower. Green peas. Carrots. Squash. Grains Whole-grain breads. Multigrain cereal. Oats and oatmeal. Brown rice. Barley. Bulgur wheat. Millet. Quinoa. Bran muffins. Popcorn. Rye wafer crackers. Meats and other proteins Navy, kidney, and pinto beans. Soybeans. Split peas. Lentils. Nuts and seeds. Dairy Fiber-fortified yogurt. Beverages Fiber-fortified soy milk. Fiber-fortified orange juice. Other foods Fiber bars. The items listed above may not be a complete list of recommended foods and beverages. Contact a dietitian for more options. What foods are not recommended? Fruits Fruit juice. Cooked, strained fruit. Vegetables Fried potatoes. Canned vegetables. Well-cooked vegetables. Grains White bread. Pasta made with refined flour. White rice. Meats and other proteins Fatty cuts of meat. Fried chicken or fried fish. Dairy Milk. Yogurt. Cream cheese. Sour cream. Fats and oils Butters. Beverages Soft drinks. Other foods Cakes and pastries. The items listed above may not be a complete list of foods and beverages to avoid. Contact a dietitian for more information. Summary  Fiber is a type of carbohydrate. It is  found in fruits, vegetables, whole grains, and beans.  There are many health benefits of eating a high-fiber diet, such as preventing constipation, lowering blood cholesterol, helping with weight loss, and reducing your risk of heart disease, diabetes, and certain cancers.  Gradually increase your intake of fiber. Increasing too fast can result in cramping, bloating, and gas. Drink plenty of water while you increase your fiber.  The best sources of fiber include whole fruits and vegetables, whole grains, nuts, seeds, and beans. This information is not intended to replace advice given to you by your health care provider. Make sure you discuss any questions you have with your health care provider. Document Revised: 09/08/2017 Document Reviewed: 09/08/2017 Elsevier Patient Education  2020 Elsevier Inc.   

## 2021-03-05 ENCOUNTER — Other Ambulatory Visit: Payer: Self-pay

## 2021-03-05 DIAGNOSIS — I714 Abdominal aortic aneurysm, without rupture, unspecified: Secondary | ICD-10-CM

## 2021-03-16 ENCOUNTER — Ambulatory Visit
Admission: RE | Admit: 2021-03-16 | Discharge: 2021-03-16 | Disposition: A | Discharge status: Home / Self Care | Payer: BC Managed Care – PPO | Source: Ambulatory Visit | Attending: Vascular Surgery | Admitting: Vascular Surgery

## 2021-03-16 ENCOUNTER — Other Ambulatory Visit: Payer: Self-pay

## 2021-03-16 DIAGNOSIS — I714 Abdominal aortic aneurysm, without rupture, unspecified: Secondary | ICD-10-CM

## 2021-03-16 MED ORDER — IOPAMIDOL (ISOVUE-370) INJECTION 76%
75.0000 mL | Freq: Once | INTRAVENOUS | Status: AC | PRN
  Administered 2021-03-16: 75 mL via INTRAVENOUS

## 2021-03-22 ENCOUNTER — Encounter: Payer: Self-pay | Admitting: Vascular Surgery

## 2021-03-22 ENCOUNTER — Other Ambulatory Visit: Payer: Self-pay

## 2021-03-22 ENCOUNTER — Ambulatory Visit: Payer: BC Managed Care – PPO | Admitting: Vascular Surgery

## 2021-03-22 VITALS — BP 149/98 | HR 64 | Temp 98.3°F | Resp 20 | Ht 70.0 in | Wt 261.5 lb

## 2021-03-22 DIAGNOSIS — I714 Abdominal aortic aneurysm, without rupture, unspecified: Secondary | ICD-10-CM

## 2021-03-22 NOTE — Progress Notes (Signed)
Patient is a 65 year old male who returns for follow-up today.  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  This was all in September 2021.  He still has some intermittent numbness and tingling in his right inner thigh.  He did have a right femoral cutdown.  He does not describe claudication symptoms.  He has quit smoking.  He states he has gained some weight and he is trying to lose this.  He does not have severe abdominal or back pain.  But he has multiple aches and pains in his hips and knee joints.  He does not really describe buttock claudication.  He is on a statin and aspirin.  Review of systems: He has no shortness of breath.  He has no chest pain.  Past Medical History:  Diagnosis Date  . AAA (abdominal aortic aneurysm) ([place])   . Adenomatous colon polyp   . Anxiety   . Depression   . GERD (gastroesophageal reflux disease)   . Headache, cluster, episodic   . Hematuria   . Hypercholesterolemia   . Hyperlipidemia   . Hypertension   . 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 ABDOMINAL AORTIC ENDOVASCULAR STENT GRAFT N/A 08/15/2020   Procedure: ABDOMINAL AORTIC ENDOVASCULAR STENT GRAFT;  Surgeon: Elam Dutch, MD;  Location: [place];  Service: Vascular;  Laterality: N/A;  . EMBOLIZATION Right 07/21/2020   Procedure: EMBOLIZATION;  Surgeon: Elam Dutch, MD;  Location: [facility];  Service: Cardiovascular;  Laterality: Right;  external iliac  . FEMORAL ARTERY EXPLORATION Right 08/15/2020   Procedure: RIGHT FEMORAL ARTERY EXPLORATION AND REPAIR;  Surgeon: Elam Dutch, MD;  Location: [facility] OR;  Service: Vascular;  Laterality: Right;  . TENDON GRAFT    . TONSILLECTOMY    . ULTRASOUND GUIDANCE FOR VASCULAR ACCESS Bilateral 08/15/2020   Procedure:  ULTRASOUND GUIDANCE FOR VASCULAR ACCESS;  Surgeon: Elam Dutch, MD;  Location: [facility] OR;  Service: Vascular;  Laterality: Bilateral;    Current Outpatient Medications on File Prior to Visit  Medication Sig Dispense Refill  . ALPRAZolam (XANAX) 0.5 MG tablet Take 0.25-0.5 mg by mouth daily as needed for anxiety.     Marland Kitchen aspirin EC 81 MG tablet Take 1 tablet (81 mg total) by mouth daily. Swallow whole. 90 tablet 3  . aspirin-sod bicarb-citric acid (ALKA-SELTZER) 325 MG TBEF tablet Take 325 mg by mouth every 6 (six) hours as needed (indigestion/heartburn/upset stomach).    . calcium carbonate (TUMS - DOSED IN MG ELEMENTAL CALCIUM) 500 MG chewable tablet Chew 2-3 tablets by mouth 3 (three) times daily as needed for indigestion or heartburn.    . diclofenac (VOLTAREN) 75 MG EC tablet Take 75 mg by mouth 2 (two) times daily.    Marland Kitchen escitalopram (LEXAPRO) 10 MG tablet Take 10 mg by mouth daily.    . finasteride (PROSCAR) 5 MG tablet Take 5 mg by mouth daily.    Marland Kitchen ibuprofen (ADVIL) 200 MG tablet Take 200-400 mg by mouth every 6 (six) hours as needed (pain.).    Marland Kitchen metoprolol succinate (TOPROL-XL) 25 MG 24 hr tablet Take 25 mg by mouth daily.    . nicotine (NICOTROL) 10 MG inhaler Inhale 1 continuous puffing into the lungs as needed for smoking cessation.    Marland Kitchen  rosuvastatin (CRESTOR) 20 MG tablet Take 1 tablet (20 mg total) by mouth daily. 90 tablet 3   No current facility-administered medications on file prior to visit.    Physical exam:  Vitals:   03/22/21 1106  BP: (!) 149/98  Pulse: 64  Resp: 20  Temp: 98.3 F (36.8 C)  SpO2: 95%  Weight: 261 lb 8 oz (118.6 kg)  Height: 5\' 10"  (1.778 m)   Extremities: 2+ dorsalis pedis pulses 2+ femoral pulses bilaterally  Abdomen: Soft nontender nondistended no palpable pulsatile mass  Data: Patient has CT scan of the abdomen and pelvis on March 16, 2021.  This showed the aneurysm diameter had decreased from 6.8 to 6.6 cm.  There was no endoleak.  Coil  embolization of the right internal iliac artery was in place with a 3.7 cm aneurysm.  Right common iliac artery is well excluded but the stent graft with a 2.9 cm diameter.  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  Current diameter of abdominal component 6.6 right internal iliac 3.7 right common iliac 2.9 cm.  Plan: Patient will have a follow-up ultrasound to be seen in APP clinic in 1 year.  Ruta Hinds, MD [facility] Office: [phone]

## 2021-12-20 IMAGING — CT CT CTA ABD/PEL W/CM AND/OR W/O CM
1 of 5 series · 12 of 32 positions shown, 17 images · IV contrast (iopamidol)
Comparison: CTA 09/06/2020

CLINICAL DATA: Endovascular repair of abdominal aortic aneurysm.

EXAM:
CTA ABDOMEN AND PELVIS WITHOUT AND WITH CONTRAST
TECHNIQUE: Multidetector CT imaging of the abdomen and pelvis was performed
using the standard protocol during bolus administration of
intravenous contrast. Multiplanar reconstructed images and MIPs were
obtained and reviewed to evaluate the vascular anatomy.
CONTRAST:  75mL [id number] IOPAMIDOL ([id number]) INJECTION 76%

[Series 6: angio · axial · 0.88mm/px · z∈[-461,-67]mm · 12 of 227 slices shown, 17 images]
[im 15/227  soft-tissue]
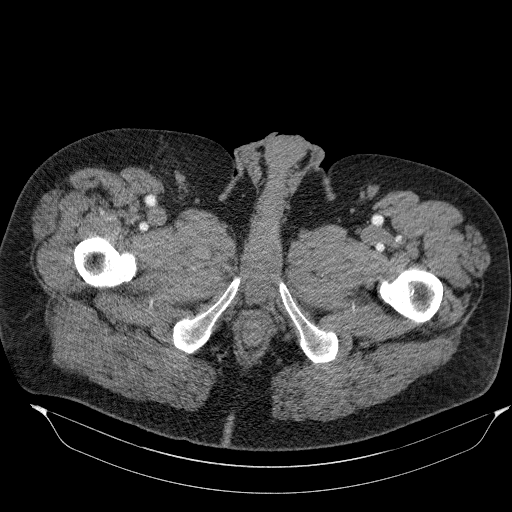
[im 15/227  bone]
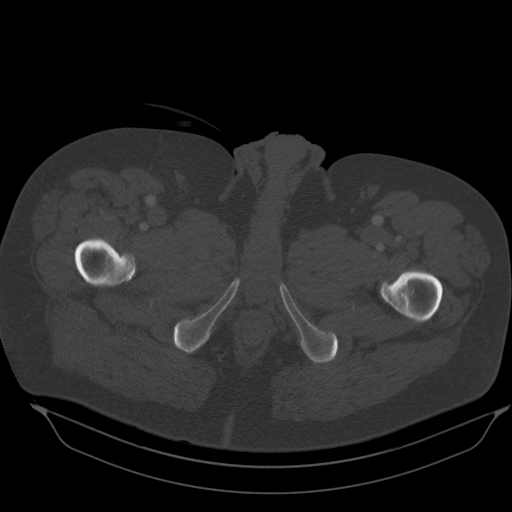
[im 43/227  soft-tissue]
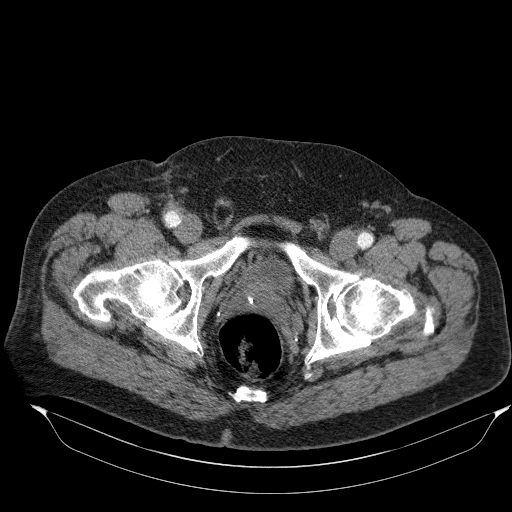
[im 57/227  soft-tissue]
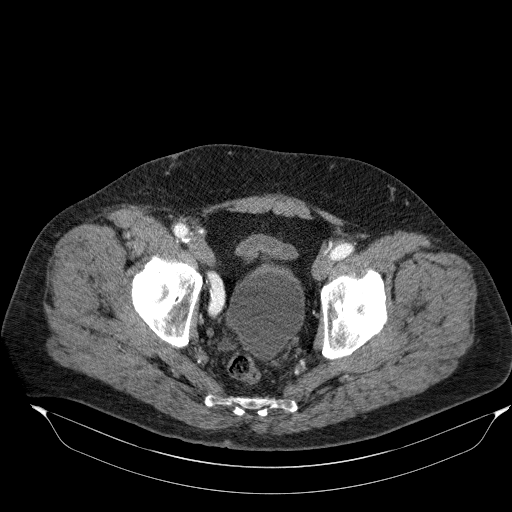
[im 71/227  soft-tissue]
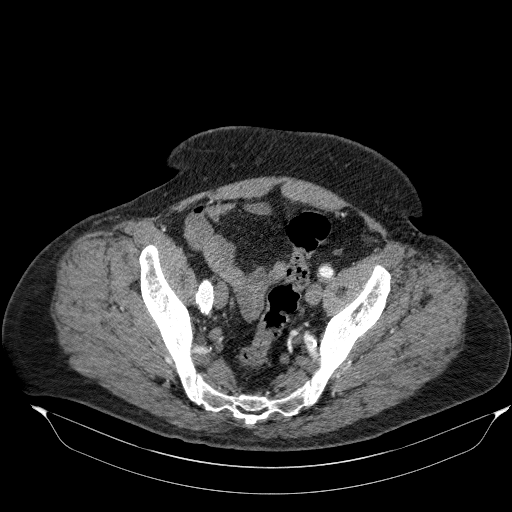
[im 99/227  soft-tissue]
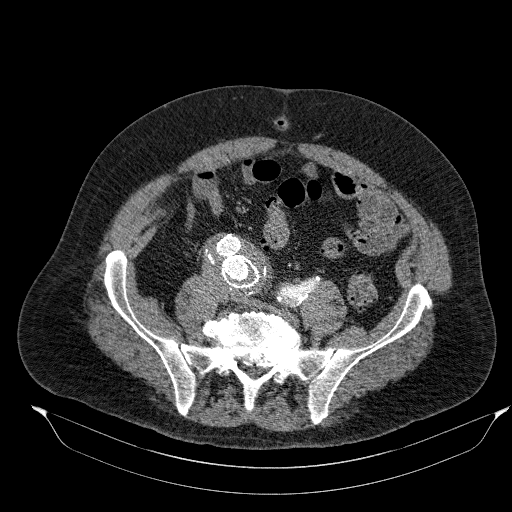
[im 114/227  soft-tissue]
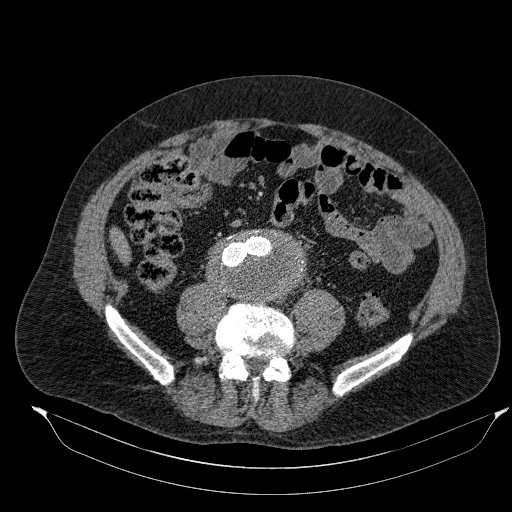
[im 128/227  soft-tissue]
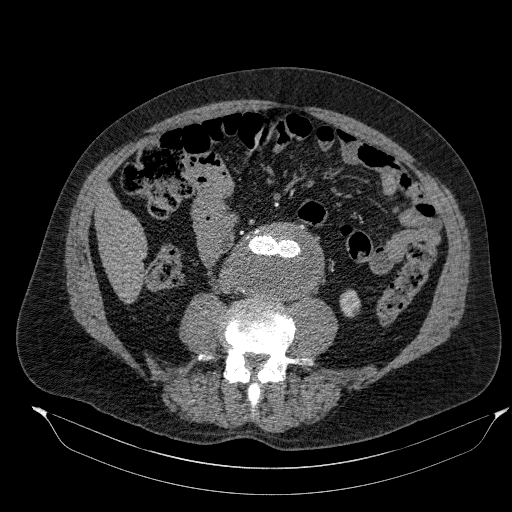
[im 156/227  soft-tissue]
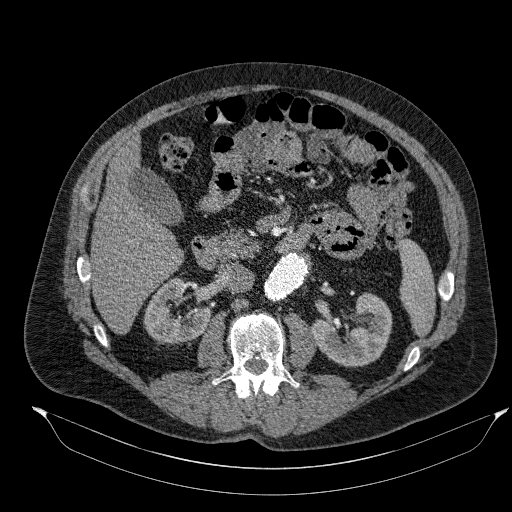
[im 170/227  soft-tissue]
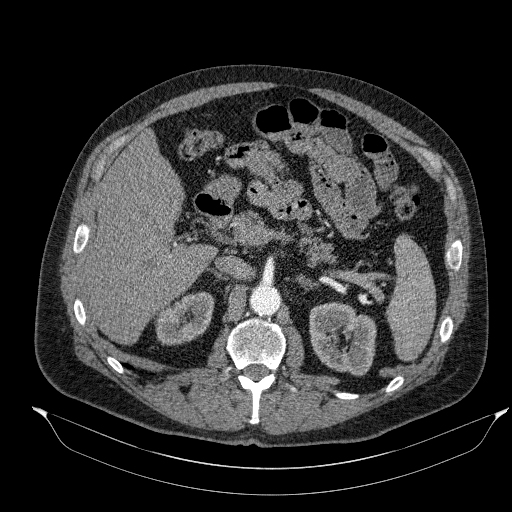
[im 170/227  lung]
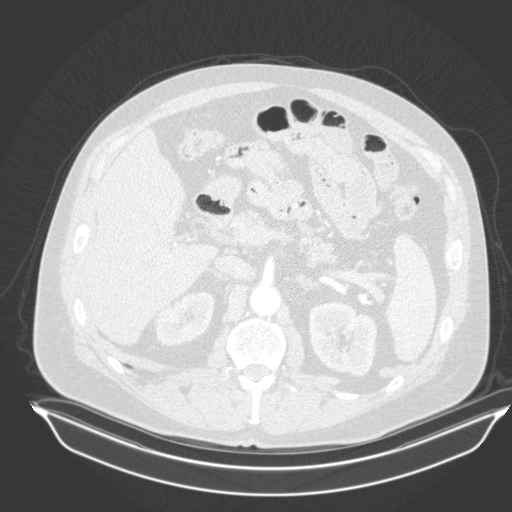
[im 170/227  bone]
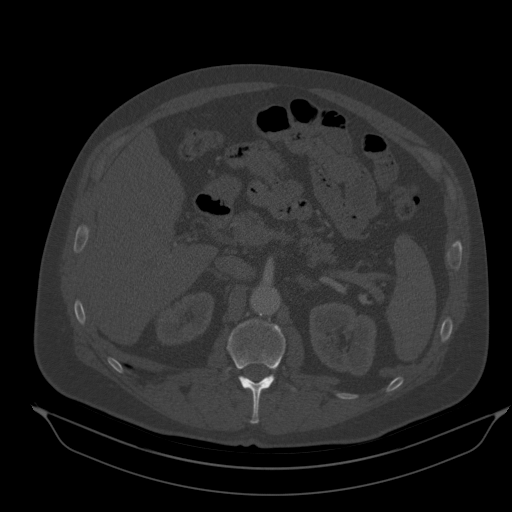
[im 184/227  soft-tissue]
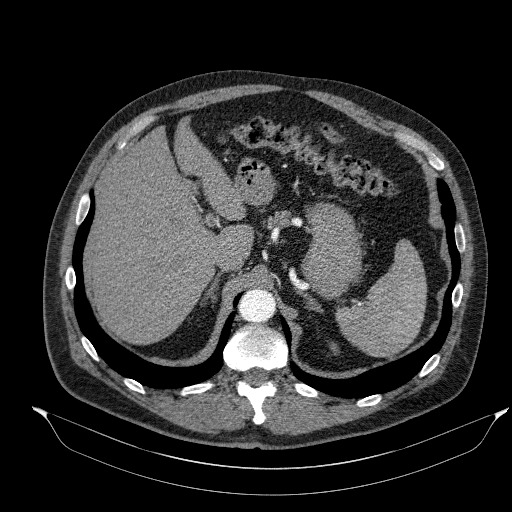
[im 184/227  lung]
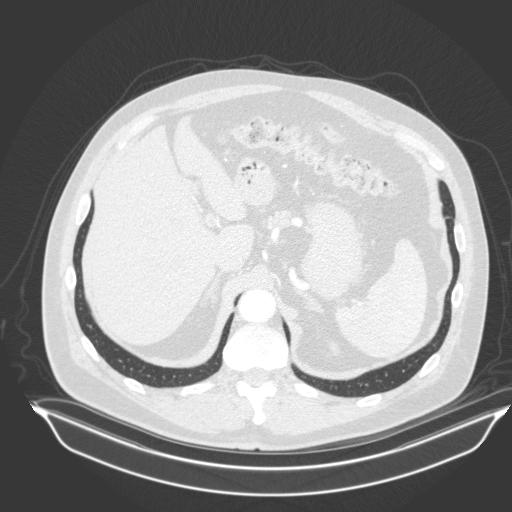
[im 198/227  lung]
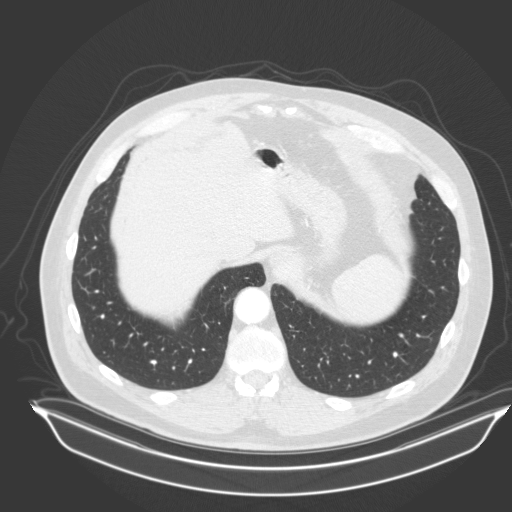
[im 212/227  soft-tissue]
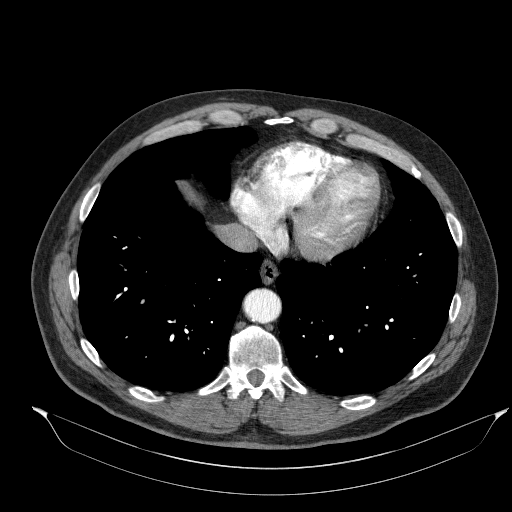
[im 212/227  lung]
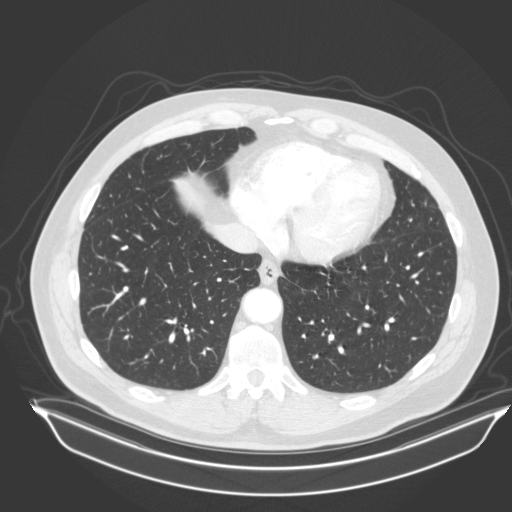

[12 of 32 positions shown; findings below may reference images not displayed]

FINDINGS: VASCULAR

Aorta: Infrarenal aortic stent graft is stable position. Main body
graft and bilateral limbs are patent. Abdominal aortic aneurysm sac
measures 6.6 cm in the AP dimension on sequence 6, image 103 and
previously measured 6.8 cm. No evidence for an endoleak.

Celiac: Stable ectasia of the celiac trunk measuring up to 1.2 cm.
No significant celiac artery stenosis. Main branch vessels are
patent.

SMA: Patent without evidence of aneurysm, dissection, vasculitis or
significant stenosis.

Renals: Both renal arteries are patent without evidence of aneurysm,
dissection, vasculitis, fibromuscular dysplasia or significant
stenosis.

IMA: Distal reconstitution through collateral flow.

Inflow: Right graft limbs extend into the right external iliac
artery and similar to the previous examination. Right external iliac
artery is patent. Right common iliac artery aneurysm sac measures
2.9 cm and stable. Again noted is an excluded aneurysm of the right
internal iliac artery that measures 3.7 cm and stable. Embolization
coils associated with the right internal iliac artery and right
internal iliac artery aneurysm. Stable appearance of the left limb
grafts extending into the distal left common iliac artery. Left
internal and left external iliac arteries are patent without
aneurysm.

Proximal Outflow: Stable postoperative changes in the right groin.
The proximal femoral arteries are patent bilaterally.

Veins: Visualized venous structures are unremarkable.

Review of the MIP images confirms the above findings.

NON-VASCULAR

Lower chest: Lung bases are clear.

Hepatobiliary: No acute abnormality to the liver or gallbladder.

Pancreas: Unremarkable. No pancreatic ductal dilatation or
surrounding inflammatory changes.

Spleen: Normal in size without focal abnormality.

Adrenals/Urinary Tract: Normal appearance of the adrenal glands.
Negative for hydronephrosis. No suspicious renal lesions. Small
amount of fluid in the urinary bladder.

Stomach/Bowel: Stomach is within normal limits. Appendix appears
normal. No evidence of bowel wall thickening, distention, or
inflammatory changes.

Lymphatic: No abdominal or pelvic lymph node enlargement.

Reproductive: Prostate calcifications.

Other: Negative for free fluid.  Negative for free air.

Musculoskeletal: Multilevel degenerative changes in the lumbar
spine.
IMPRESSION: VASCULAR

1. Endovascular repair of the abdominal aortic aneurysm and right
iliac artery aneurysms. The abdominal aortic aneurysm sac has
slightly decreased in size, now measuring 6.6 cm. No evidence for an
endoleak. Stable size of right common and right internal iliac
artery aneurysm sacs.
2. Stable ectasia of the celiac trunk.

NON-VASCULAR

1. No acute abnormality in the abdomen or pelvis.

## 2022-01-08 NOTE — Progress Notes (Signed)
Cardiology Office Note   Date:  01/10/2022   ID:  Benjamin Preston, DOB 1956/05/28, MRN [id number]  PCP:  Gaynelle Arabian, MD    Chief Complaint  Patient presents with   Follow-up    Wants to know about circulation in legs and if he has a good pulse   RF for CAD  Wt Readings from Last 3 Encounters:  01/10/22 253 lb (114.8 kg)  03/22/21 261 lb 8 oz (118.6 kg)  10/09/20 231 lb 12.8 oz (105.1 kg)       History of Present Illness: Benjamin Preston is a 66 y.o. male  with a history of hypertension.  I first saw him in August 2021 when a 7 cm abdominal aortic aneurysm was detected on the CT scan.  He was managed by Dr. Oneida Alar.  He was cleared for his aneurysm repair since he was active without cardiac symptoms.  In September 2021, he had successful endovascular repair.  He has had difficulty stopping smoking in the past.     He did well after the AAA procedure.  He has had some blood in urine.  He has some enlargement of the prostate and is being treated with medication. This makes him anxious.   Has left knee pain.  Some pain in the left thigh as well.  Walking more.  Walking helps the knee pain.   Denies : Chest pain. Dizziness. Leg edema. Nitroglycerin use. Orthopnea. Palpitations. Paroxysmal nocturnal dyspnea. Shortness of breath. Syncope.    Travels from [place] to see doctor's in [place].   Negative colonoscopy. Had been anemic to 10.  Now 12.5 in 2/23.    Past Medical History:  Diagnosis Date   AAA (abdominal aortic aneurysm)    Adenomatous colon polyp    Anxiety    Depression    GERD (gastroesophageal reflux disease)    Headache, cluster, episodic    Hematuria    Hypercholesterolemia    Hyperlipidemia    Hypertension    Recovering alcoholic ([facility])     Past Surgical History:  Procedure Laterality Date   ABDOMINAL AORTIC ENDOVASCULAR STENT GRAFT N/A 08/15/2020   Procedure: ABDOMINAL AORTIC ENDOVASCULAR STENT GRAFT;  Surgeon: Elam Dutch, MD;  Location: [place];   Service: Vascular;  Laterality: N/A;   EMBOLIZATION Right 07/21/2020   Procedure: EMBOLIZATION;  Surgeon: Elam Dutch, MD;  Location: [facility];  Service: Cardiovascular;  Laterality: Right;  external iliac   FEMORAL ARTERY EXPLORATION Right 08/15/2020   Procedure: RIGHT FEMORAL ARTERY EXPLORATION AND REPAIR;  Surgeon: Elam Dutch, MD;  Location: [facility] OR;  Service: Vascular;  Laterality: Right;   TENDON GRAFT     TONSILLECTOMY     ULTRASOUND GUIDANCE FOR VASCULAR ACCESS Bilateral 08/15/2020   Procedure: ULTRASOUND GUIDANCE FOR VASCULAR ACCESS;  Surgeon: Elam Dutch, MD;  Location: [facility] OR;  Service: Vascular;  Laterality: Bilateral;     Current Outpatient Medications  Medication Sig Dispense Refill   ALPRAZolam (XANAX) 0.5 MG tablet Take 0.25-0.5 mg by mouth daily as needed for anxiety.      aspirin-sod bicarb-citric acid (ALKA-SELTZER) 325 MG TBEF tablet Take 325 mg by mouth every 6 (six) hours as needed (indigestion/heartburn/upset stomach).     calcium carbonate (TUMS - DOSED IN MG ELEMENTAL CALCIUM) 500 MG chewable tablet Chew 2-3 tablets by mouth 3 (three) times daily as needed for indigestion or heartburn.     escitalopram (LEXAPRO) 10 MG tablet Take 10 mg by mouth daily.  finasteride (PROSCAR) 5 MG tablet Take 5 mg by mouth daily.     ibuprofen (ADVIL) 200 MG tablet Take 200-400 mg by mouth every 6 (six) hours as needed (pain.).     metoprolol succinate (TOPROL-XL) 25 MG 24 hr tablet Take 25 mg by mouth daily.     nicotine (NICOTROL) 10 MG inhaler Inhale 1 continuous puffing into the lungs as needed for smoking cessation.     rosuvastatin (CRESTOR) 20 MG tablet Take 1 tablet (20 mg total) by mouth daily. 90 tablet 3   vitamin B-12 (CYANOCOBALAMIN) 1000 MCG tablet Take 1,000 mcg by mouth daily.     iron polysaccharides (NIFEREX) 150 MG capsule Take 150 mg by mouth daily.     methocarbamol (ROBAXIN) 500 MG tablet Take 500-1,000 mg by mouth every 6 (six) hours as  needed.     omeprazole (PRILOSEC) 40 MG capsule Take 1 capsule by mouth daily.     No current facility-administered medications for this visit.    Allergies:   Patient has no known allergies.    Social History:  The patient  reports that he quit smoking about 16 months ago. His smoking use included cigarettes. He smoked an average of .25 packs per day. He has never used smokeless tobacco. He reports that he does not currently use alcohol. He reports that he does not use drugs.   Family History:  The patient's family history includes Diverticulitis in his mother; Mesothelioma in his mother; Prostate cancer in his father.    ROS:  Please see the history of present illness.   Otherwise, review of systems are positive for knee pain.   All other systems are reviewed and negative.    PHYSICAL EXAM: VS:  BP 126/90    Pulse 74    Ht 5' 11.75" (1.822 m)    Wt 253 lb (114.8 kg)    SpO2 98%    BMI 34.55 kg/m  , BMI Body mass index is 34.55 kg/m. GEN: Well nourished, well developed, in no acute distress HEENT: normal Neck: no JVD, carotid bruits, or masses Cardiac: RRR; no murmurs, rubs, or gallops,no edema  Respiratory:  clear to auscultation bilaterally, normal work of breathing GI: soft, nontender, nondistended, + BS MS: no deformity or atrophy, 2+ left PT pulse Skin: warm and dry, no rash Neuro:  Strength and sensation are intact Psych: euthymic mood, full affect   EKG:   The ekg ordered today demonstrates normal ECG   Recent Labs: No results found for requested labs within last 8760 hours.   Lipid Panel No results found for: CHOL, TRIG, HDL, CHOLHDL, VLDL, LDLCALC, LDLDIRECT   Other studies Reviewed: Additional studies/ records that were reviewed today with results demonstrating: labs reviewed- normal renal function.   ASSESSMENT AND PLAN:  Hyperlipidemia: Whole food, plant-based diet recommended.  The current medical regimen is effective;  continue present plan and  medications. LDL 37 on rosuvastatin.  Increase exercise to target below. HTN: Avoid processed foods. The current medical regimen is effective;  continue present plan and medications.  Controlled at home.  Prior Tobacco abuse: He needs to avoid all tobacco products given his history of aortic disease.  This is certainly a risk factor for heart disease as well.  Using nicotrol to help quit smoking. Aortic atherosclerosis/ AAA repair: Followed by [facility]. 2+ left PT pulse.  We discussed routine CAD screening with calcium score test in the past but he has some anxiety with CT scans.    Current medicines  are reviewed at length with the patient today.  The patient concerns regarding his medicines were addressed.  The following changes have been made:  No change  Labs/ tests ordered today include:  No orders of the defined types were placed in this encounter.   Recommend 150 minutes/week of aerobic exercise Low fat, low carb, high fiber diet recommended  Disposition:   FU in 1 year   Signed, Larae Grooms, MD  01/10/2022 9:48 AM    [facility] [address], [place], [place]  [address] Phone: [phone]; Fax: [phone]

## 2022-01-10 ENCOUNTER — Other Ambulatory Visit: Payer: Self-pay

## 2022-01-10 ENCOUNTER — Encounter: Payer: Self-pay | Admitting: Interventional Cardiology

## 2022-01-10 ENCOUNTER — Ambulatory Visit: Payer: BC Managed Care – PPO | Admitting: Interventional Cardiology

## 2022-01-10 VITALS — BP 126/90 | HR 74 | Ht 71.75 in | Wt 253.0 lb

## 2022-01-10 DIAGNOSIS — I7143 Infrarenal abdominal aortic aneurysm, without rupture: Secondary | ICD-10-CM

## 2022-01-10 DIAGNOSIS — E782 Mixed hyperlipidemia: Secondary | ICD-10-CM

## 2022-01-10 DIAGNOSIS — Z720 Tobacco use: Secondary | ICD-10-CM | POA: Diagnosis not present

## 2022-01-10 DIAGNOSIS — I700 Atherosclerosis of aorta: Secondary | ICD-10-CM | POA: Diagnosis not present

## 2022-01-10 DIAGNOSIS — I10 Essential (primary) hypertension: Secondary | ICD-10-CM | POA: Diagnosis not present

## 2022-01-10 NOTE — Patient Instructions (Signed)
Medication Instructions:  Your physician recommends that you continue on your current medications as directed. Please refer to the Current Medication list given to you today.  *If you need a refill on your cardiac medications before your next appointment, please call your pharmacy*   Lab Work: none If you have labs (blood work) drawn today and your tests are completely normal, you will receive your results only by: MyChart Message (if you have MyChart) OR A paper copy in the mail If you have any lab test that is abnormal or we need to change your treatment, we will call you to review the results.   Testing/Procedures: none   Follow-Up: At [facility], you and your health needs are our priority.  As part of our continuing mission to provide you with exceptional heart care, we have created designated Provider Care Teams.  These Care Teams include your primary Cardiologist (physician) and Advanced Practice Providers (APPs -  Physician Assistants and Nurse Practitioners) who all work together to provide you with the care you need, when you need it.  We recommend signing up for the patient portal called "MyChart".  Sign up information is provided on this After Visit Summary.  MyChart is used to connect with patients for Virtual Visits (Telemedicine).  Patients are able to view lab/test results, encounter notes, upcoming appointments, etc.  Non-urgent messages can be sent to your provider as well.   To learn more about what you can do with MyChart, go to [email or web address].    Your next appointment:   12 month(s)  The format for your next appointment:   In Person  Provider:   Jayadeep Varanasi, MD     Other Instructions  High-Fiber Eating Plan Fiber, also called dietary fiber, is a type of carbohydrate. It is found foods such as fruits, vegetables, whole grains, and beans. A high-fiber diet can have many health benefits. Your health care provider may recommend a high-fiber diet  to help: Prevent constipation. Fiber can make your bowel movements more regular. Lower your cholesterol. Relieve the following conditions: Inflammation of veins in the anus (hemorrhoids). Inflammation of specific areas of the digestive tract (uncomplicated diverticulosis). A problem of the large intestine, also called the colon, that sometimes causes pain and diarrhea (irritable bowel syndrome, or IBS). Prevent overeating as part of a weight-loss plan. Prevent heart disease, type 2 diabetes, and certain cancers. What are tips for following this plan? Reading food labels  Check the nutrition facts label on food products for the amount of dietary fiber. Choose foods that have 5 grams of fiber or more per serving. The goals for recommended daily fiber intake include: Men (age 50 or younger): 34-38 g. Men (over age 50): 28-34 g. Women (age 50 or younger): 25-28 g. Women (over age 50): 22-25 g. Your daily fiber goal is _____________ g. Shopping Choose whole fruits and vegetables instead of processed forms, such as apple juice or applesauce. Choose a wide variety of high-fiber foods such as avocados, lentils, oats, and kidney beans. Read the nutrition facts label of the foods you choose. Be aware of foods with added fiber. These foods often have high sugar and sodium amounts per serving. Cooking Use whole-grain flour for baking and cooking. Cook with brown rice instead of white rice. Meal planning Start the day with a breakfast that is high in fiber, such as a cereal that contains 5 g of fiber or more per serving. Eat breads and cereals that are made with whole-grain flour instead of   refined flour or white flour. Eat brown rice, bulgur wheat, or millet instead of white rice. Use beans in place of meat in soups, salads, and pasta dishes. Be sure that half of the grains you eat each day are whole grains. General information You can get the recommended daily intake of dietary fiber  by: Eating a variety of fruits, vegetables, grains, nuts, and beans. Taking a fiber supplement if you are not able to take in enough fiber in your diet. It is better to get fiber through food than from a supplement. Gradually increase how much fiber you consume. If you increase your intake of dietary fiber too quickly, you may have bloating, cramping, or gas. Drink plenty of water to help you digest fiber. Choose high-fiber snacks, such as berries, raw vegetables, nuts, and popcorn. What foods should I eat? Fruits Berries. Pears. Apples. Oranges. Avocado. Prunes and raisins. Dried figs. Vegetables Sweet potatoes. Spinach. Kale. Artichokes. Cabbage. Broccoli. Cauliflower. Green peas. Carrots. Squash. Grains Whole-grain breads. Multigrain cereal. Oats and oatmeal. Brown rice. Barley. Bulgur wheat. Millet. Quinoa. Bran muffins. Popcorn. Rye wafer crackers. Meats and other proteins Navy beans, kidney beans, and pinto beans. Soybeans. Split peas. Lentils. Nuts and seeds. Dairy Fiber-fortified yogurt. Beverages Fiber-fortified soy milk. Fiber-fortified orange juice. Other foods Fiber bars. The items listed above may not be a complete list of recommended foods and beverages. Contact a dietitian for more information. What foods should I avoid? Fruits Fruit juice. Cooked, strained fruit. Vegetables Fried potatoes. Canned vegetables. Well-cooked vegetables. Grains White bread. Pasta made with refined flour. White rice. Meats and other proteins Fatty cuts of meat. Fried chicken or fried fish. Dairy Milk. Yogurt. Cream cheese. Sour cream. Fats and oils Butters. Beverages Soft drinks. Other foods Cakes and pastries. The items listed above may not be a complete list of foods and beverages to avoid. Talk with your dietitian about what choices are best for you. Summary Fiber is a type of carbohydrate. It is found in foods such as fruits, vegetables, whole grains, and beans. A high-fiber  diet has many benefits. It can help to prevent constipation, lower blood cholesterol, aid weight loss, and reduce your risk of heart disease, diabetes, and certain cancers. Increase your intake of fiber gradually. Increasing fiber too quickly may cause cramping, bloating, and gas. Drink plenty of water while you increase the amount of fiber you consume. The best sources of fiber include whole fruits and vegetables, whole grains, nuts, seeds, and beans. This information is not intended to replace advice given to you by your health care provider. Make sure you discuss any questions you have with your health care provider. Document Revised: 03/09/2020 Document Reviewed: 03/09/2020 Elsevier Patient Education  2022 Elsevier Inc.   

## 2022-03-25 ENCOUNTER — Other Ambulatory Visit: Payer: Self-pay

## 2022-03-25 DIAGNOSIS — I714 Abdominal aortic aneurysm, without rupture, unspecified: Secondary | ICD-10-CM

## 2022-03-25 DIAGNOSIS — F1021 Alcohol dependence, in remission: Secondary | ICD-10-CM | POA: Insufficient documentation

## 2022-03-25 DIAGNOSIS — K269 Duodenal ulcer, unspecified as acute or chronic, without hemorrhage or perforation: Secondary | ICD-10-CM | POA: Insufficient documentation

## 2022-03-25 DIAGNOSIS — G4710 Hypersomnia, unspecified: Secondary | ICD-10-CM | POA: Insufficient documentation

## 2022-03-25 DIAGNOSIS — K219 Gastro-esophageal reflux disease without esophagitis: Secondary | ICD-10-CM | POA: Insufficient documentation

## 2022-03-25 DIAGNOSIS — K5730 Diverticulosis of large intestine without perforation or abscess without bleeding: Secondary | ICD-10-CM | POA: Insufficient documentation

## 2022-03-25 DIAGNOSIS — Z9889 Other specified postprocedural states: Secondary | ICD-10-CM | POA: Insufficient documentation

## 2022-03-25 DIAGNOSIS — G44009 Cluster headache syndrome, unspecified, not intractable: Secondary | ICD-10-CM | POA: Insufficient documentation

## 2022-03-25 DIAGNOSIS — D509 Iron deficiency anemia, unspecified: Secondary | ICD-10-CM | POA: Insufficient documentation

## 2022-03-25 DIAGNOSIS — K2930 Chronic superficial gastritis without bleeding: Secondary | ICD-10-CM | POA: Insufficient documentation

## 2022-03-25 DIAGNOSIS — I10 Essential (primary) hypertension: Secondary | ICD-10-CM | POA: Insufficient documentation

## 2022-03-25 DIAGNOSIS — R1013 Epigastric pain: Secondary | ICD-10-CM | POA: Insufficient documentation

## 2022-03-25 DIAGNOSIS — E538 Deficiency of other specified B group vitamins: Secondary | ICD-10-CM | POA: Insufficient documentation

## 2022-03-25 DIAGNOSIS — F419 Anxiety disorder, unspecified: Secondary | ICD-10-CM | POA: Insufficient documentation

## 2022-03-25 DIAGNOSIS — D649 Anemia, unspecified: Secondary | ICD-10-CM | POA: Insufficient documentation

## 2022-03-25 DIAGNOSIS — D696 Thrombocytopenia, unspecified: Secondary | ICD-10-CM | POA: Insufficient documentation

## 2022-03-25 DIAGNOSIS — Z860100 Personal history of colon polyps, unspecified: Secondary | ICD-10-CM | POA: Insufficient documentation

## 2022-03-25 DIAGNOSIS — I700 Atherosclerosis of aorta: Secondary | ICD-10-CM | POA: Insufficient documentation

## 2022-03-25 DIAGNOSIS — F322 Major depressive disorder, single episode, severe without psychotic features: Secondary | ICD-10-CM | POA: Insufficient documentation

## 2022-03-25 DIAGNOSIS — N400 Enlarged prostate without lower urinary tract symptoms: Secondary | ICD-10-CM | POA: Insufficient documentation

## 2022-03-25 DIAGNOSIS — E7800 Pure hypercholesterolemia, unspecified: Secondary | ICD-10-CM | POA: Insufficient documentation

## 2022-04-04 ENCOUNTER — Ambulatory Visit: Payer: BC Managed Care – PPO | Admitting: Physician Assistant

## 2022-04-04 ENCOUNTER — Ambulatory Visit (HOSPITAL_COMMUNITY)
Admission: RE | Admit: 2022-04-04 | Discharge: 2022-04-04 | Disposition: A | Discharge status: Home / Self Care | Payer: BC Managed Care – PPO | Source: Ambulatory Visit | Attending: Vascular Surgery | Admitting: Vascular Surgery

## 2022-04-04 VITALS — BP 118/77 | HR 68 | Temp 97.9°F | Resp 20 | Ht 71.5 in | Wt 255.9 lb

## 2022-04-04 DIAGNOSIS — I714 Abdominal aortic aneurysm, without rupture, unspecified: Secondary | ICD-10-CM | POA: Diagnosis present

## 2022-04-04 NOTE — Progress Notes (Signed)
Office Note     CC:  follow up Requesting Provider:  Gaynelle Arabian, MD  HPI: Benjamin Preston is a 66 y.o. (1956/02/01) male who presents for surveillance of EVAR.  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 Oneida Alar in September 2021.  He denies any new or changing abdominal or back pain.  He does have left hip pain and he is concerned this is related to his aneurysm repair however he also knows that he has been told he would require a left hip replacement.  He denies any claudication, rest pain, or nonhealing wounds of bilateral lower extremities.  He is a former smoker.  He is on an aspirin and a statin daily.   Past Medical History:  Diagnosis Date   AAA (abdominal aortic aneurysm) (HCC)    Adenomatous colon polyp    Anxiety    Depression    GERD (gastroesophageal reflux disease)    Headache, cluster, episodic    Hematuria    Hypercholesterolemia    Hyperlipidemia    Hypertension    Recovering alcoholic (HCC)     Past Surgical History:  Procedure Laterality Date   ABDOMINAL AORTIC ENDOVASCULAR STENT GRAFT N/A 08/15/2020   Procedure: ABDOMINAL AORTIC ENDOVASCULAR STENT GRAFT;  Surgeon: Elam Dutch, MD;  Location: [place];  Service: Vascular;  Laterality: N/A;   EMBOLIZATION Right 07/21/2020   Procedure: EMBOLIZATION;  Surgeon: Elam Dutch, MD;  Location: [place] [facility];  Service: Cardiovascular;  Laterality: Right;  external iliac   FEMORAL ARTERY EXPLORATION Right 08/15/2020   Procedure: RIGHT FEMORAL ARTERY EXPLORATION AND REPAIR;  Surgeon: Elam Dutch, MD;  Location: [facility] OR;  Service: Vascular;  Laterality: Right;   TENDON GRAFT     TONSILLECTOMY     ULTRASOUND GUIDANCE FOR VASCULAR ACCESS Bilateral 08/15/2020   Procedure: ULTRASOUND GUIDANCE FOR VASCULAR ACCESS;  Surgeon: Elam Dutch, MD;  Location: [facility] OR;  Service: Vascular;   Laterality: Bilateral;    Social History   Socioeconomic History   Marital status: Married    Spouse name: Not on file   Number of children: Not on file   Years of education: Not on file   Highest education level: Not on file  Occupational History   Not on file  Tobacco Use   Smoking status: Former    Packs/day: 0.25    Types: Cigarettes    Quit date: 09/10/2020    Years since quitting: 1.5    Passive exposure: Never   Smokeless tobacco: Never  Vaping Use   Vaping Use: Never used  Substance and Sexual Activity   Alcohol use: Not Currently   Drug use: Never   Sexual activity: Not on file  Other Topics Concern   Not on file  Social History Narrative   Not on file   Social Determinants of Health   Financial Resource Strain: Not on file  Food Insecurity: Not on file  Transportation Needs: Not on file  Physical Activity: Not on file  Stress: Not on file  Social Connections: Not on file  Intimate Partner Violence: Not on file    Family History  Problem Relation Age of Onset   Mesothelioma Mother    Diverticulitis Mother    Prostate cancer Father     Current Outpatient Medications  Medication Sig Dispense Refill   ALPRAZolam (XANAX) 0.5 MG tablet Take 0.25-0.5 mg by mouth daily as  needed for anxiety.      aspirin-sod bicarb-citric acid (ALKA-SELTZER) 325 MG TBEF tablet Take 325 mg by mouth every 6 (six) hours as needed (indigestion/heartburn/upset stomach).     calcium carbonate (TUMS - DOSED IN MG ELEMENTAL CALCIUM) 500 MG chewable tablet Chew 2-3 tablets by mouth 3 (three) times daily as needed for indigestion or heartburn.     escitalopram (LEXAPRO) 10 MG tablet Take 10 mg by mouth daily.     finasteride (PROSCAR) 5 MG tablet Take 5 mg by mouth daily.     ibuprofen (ADVIL) 200 MG tablet Take 200-400 mg by mouth every 6 (six) hours as needed (pain.).     iron polysaccharides (NIFEREX) 150 MG capsule Take 150 mg by mouth daily.     methocarbamol (ROBAXIN) 500 MG  tablet Take 500-1,000 mg by mouth every 6 (six) hours as needed.     metoprolol succinate (TOPROL-XL) 25 MG 24 hr tablet Take 25 mg by mouth daily.     nicotine (NICOTROL) 10 MG inhaler Inhale 1 continuous puffing into the lungs as needed for smoking cessation.     omeprazole (PRILOSEC) 40 MG capsule Take 1 capsule by mouth daily.     rosuvastatin (CRESTOR) 20 MG tablet Take 1 tablet (20 mg total) by mouth daily. 90 tablet 3   vitamin B-12 (CYANOCOBALAMIN) 1000 MCG tablet Take 1,000 mcg by mouth daily.     No current facility-administered medications for this visit.    No Known Allergies   REVIEW OF SYSTEMS:   '[X]'$  denotes positive finding, '[ ]'$  denotes negative finding Cardiac  Comments:  Chest pain or chest pressure:    Shortness of breath upon exertion:    Short of breath when lying flat:    Irregular heart rhythm:        Vascular    Pain in calf, thigh, or hip brought on by ambulation:    Pain in feet at night that wakes you up from your sleep:     Blood clot in your veins:    Leg swelling:         Pulmonary    Oxygen at home:    Productive cough:     Wheezing:         Neurologic    Sudden weakness in arms or legs:     Sudden numbness in arms or legs:     Sudden onset of difficulty speaking or slurred speech:    Temporary loss of vision in one eye:     Problems with dizziness:         Gastrointestinal    Blood in stool:     Vomited blood:         Genitourinary    Burning when urinating:     Blood in urine:        Psychiatric    Major depression:         Hematologic    Bleeding problems:    Problems with blood clotting too easily:        Skin    Rashes or ulcers:        Constitutional    Fever or chills:      PHYSICAL EXAMINATION:  Vitals:   04/04/22 1029  BP: 118/77  Pulse: 68  Resp: 20  Temp: 97.9 F (36.6 C)  TempSrc: Temporal  SpO2: 96%  Weight: 255 lb 14.4 oz (116.1 kg)  Height: 5' 11.5" (1.816 m)    General:  WDWN in NAD; vital signs  documented above Gait: Not observed HENT: WNL, normocephalic Pulmonary: normal non-labored breathing , without Rales, rhonchi,  wheezing Cardiac:  HR Abdomen: soft, NT, no masses Skin: without rashes Vascular Exam/Pulses:  Right Left  Radial 2+ (normal) 2+ (normal)   Extremities: without ischemic changes, without Gangrene , without cellulitis; without open wounds;  Musculoskeletal: no muscle wasting or atrophy  Neurologic: A&O X 3;  No focal weakness or paresthesias are detected Psychiatric:  The pt has Normal affect.   Non-Invasive Vascular Imaging:   6.4 cm AAA sac with no endoleaks    ASSESSMENT/PLAN:: 66 y.o. male here for follow up for surveillance of EVAR  -EVAR duplex demonstrates a 6.4 cm AAA sac with no endoleaks.  AAA was 6.6 cm at the time of repair -Recheck EVAR duplex in 1 year per protocol -Patient knows to follow-up sooner with any questions or concerns   Dagoberto Ligas, PA-C [facility] [phone]  Clinic MD:   Scot Dock

## 2023-03-31 LAB — LAB REPORT - SCANNED: EGFR: 94

## 2023-04-07 ENCOUNTER — Other Ambulatory Visit: Payer: Self-pay | Admitting: *Deleted

## 2023-04-07 DIAGNOSIS — I714 Abdominal aortic aneurysm, without rupture, unspecified: Secondary | ICD-10-CM

## 2023-04-17 ENCOUNTER — Ambulatory Visit: Payer: BC Managed Care – PPO | Admitting: Physician Assistant

## 2023-04-17 ENCOUNTER — Ambulatory Visit (HOSPITAL_COMMUNITY)
Admission: RE | Admit: 2023-04-17 | Discharge: 2023-04-17 | Disposition: A | Discharge status: Home / Self Care | Payer: BC Managed Care – PPO | Source: Ambulatory Visit | Attending: Vascular Surgery | Admitting: Vascular Surgery

## 2023-04-17 VITALS — BP 151/95 | HR 96 | Temp 98.5°F | Resp 16 | Ht 71.5 in | Wt 234.0 lb

## 2023-04-17 DIAGNOSIS — I714 Abdominal aortic aneurysm, without rupture, unspecified: Secondary | ICD-10-CM

## 2023-04-17 DIAGNOSIS — Z9889 Other specified postprocedural states: Secondary | ICD-10-CM | POA: Diagnosis not present

## 2023-04-17 NOTE — Progress Notes (Signed)
Office Note     CC:  follow up Requesting Provider:  Blair Heys, MD  HPI: Benjamin Preston is a 67 y.o. (1956/11/02) male who presents for surveillance follow up of EVAR.  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 Darrick Penna in September 2021.  He presents with his wife today. He denies any new or changing abdominal or back pain. He does have some chronic low back pain but he feels this is more related to his left hip pain. He continues to have left hip pain and is anticipating left hip replacement in near future. He denies any claudication, rest pain, or nonhealing wounds of bilateral lower extremities.  He has returned to smoking.  He is on an aspirin and a statin daily.   Past Medical History:  Diagnosis Date   AAA (abdominal aortic aneurysm) (HCC)    Adenomatous colon polyp    Anxiety    Depression    GERD (gastroesophageal reflux disease)    Headache, cluster, episodic    Hematuria    Hypercholesterolemia    Hyperlipidemia    Hypertension    Recovering alcoholic (HCC)     Past Surgical History:  Procedure Laterality Date   ABDOMINAL AORTIC ENDOVASCULAR STENT GRAFT N/A 08/15/2020   Procedure: ABDOMINAL AORTIC ENDOVASCULAR STENT GRAFT;  Surgeon: Sherren Kerns, MD;  Location: [facility] OR;  Service: Vascular;  Laterality: N/A;   EMBOLIZATION Right 07/21/2020   Procedure: EMBOLIZATION;  Surgeon: Sherren Kerns, MD;  Location: [facility];  Service: Cardiovascular;  Laterality: Right;  external iliac   FEMORAL ARTERY EXPLORATION Right 08/15/2020   Procedure: RIGHT FEMORAL ARTERY EXPLORATION AND REPAIR;  Surgeon: Sherren Kerns, MD;  Location: [facility] OR;  Service: Vascular;  Laterality: Right;   TENDON GRAFT     TONSILLECTOMY     ULTRASOUND GUIDANCE FOR VASCULAR ACCESS Bilateral 08/15/2020   Procedure: ULTRASOUND GUIDANCE FOR VASCULAR ACCESS;  Surgeon:  Sherren Kerns, MD;  Location: [facility] OR;  Service: Vascular;  Laterality: Bilateral;    Social History   Socioeconomic History   Marital status: Married    Spouse name: Not on file   Number of children: Not on file   Years of education: Not on file   Highest education level: Not on file  Occupational History   Not on file  Tobacco Use   Smoking status: Former    Packs/day: .25    Types: Cigarettes    Quit date: 09/10/2020    Years since quitting: 2.6    Passive exposure: Never   Smokeless tobacco: Never  Vaping Use   Vaping Use: Never used  Substance and Sexual Activity   Alcohol use: Not Currently   Drug use: Never   Sexual activity: Not on file  Other Topics Concern   Not on file  Social History Narrative   Not on file   Social Determinants of Health   Financial Resource Strain: Not on file  Food Insecurity: Not on file  Transportation Needs: Not on file  Physical Activity: Not on file  Stress: Not on file  Social Connections: Not on file  Intimate Partner Violence: Not on file   Family History  Problem Relation Age of Onset   Mesothelioma Mother    Diverticulitis Mother    Prostate cancer Father     Current Outpatient Medications  Medication Sig Dispense Refill   ALPRAZolam (XANAX) 0.5 MG  tablet Take 0.25-0.5 mg by mouth daily as needed for anxiety.      aspirin-sod bicarb-citric acid (ALKA-SELTZER) 325 MG TBEF tablet Take 325 mg by mouth every 6 (six) hours as needed (indigestion/heartburn/upset stomach).     calcium carbonate (TUMS - DOSED IN MG ELEMENTAL CALCIUM) 500 MG chewable tablet Chew 2-3 tablets by mouth 3 (three) times daily as needed for indigestion or heartburn.     escitalopram (LEXAPRO) 10 MG tablet Take 10 mg by mouth daily.     finasteride (PROSCAR) 5 MG tablet Take 5 mg by mouth daily.     ibuprofen (ADVIL) 200 MG tablet Take 200-400 mg by mouth every 6 (six) hours as needed (pain.).     iron polysaccharides (NIFEREX) 150 MG capsule Take  150 mg by mouth daily.     meloxicam (MOBIC) 7.5 MG tablet Take 1 tablet by mouth 2 (two) times daily.     methocarbamol (ROBAXIN) 500 MG tablet Take 500-1,000 mg by mouth every 6 (six) hours as needed.     metoprolol succinate (TOPROL-XL) 25 MG 24 hr tablet Take 25 mg by mouth daily.     nicotine (NICOTROL) 10 MG inhaler Inhale 1 continuous puffing into the lungs as needed for smoking cessation.     omeprazole (PRILOSEC) 40 MG capsule Take 1 capsule by mouth daily.     rosuvastatin (CRESTOR) 20 MG tablet Take 1 tablet (20 mg total) by mouth daily. 90 tablet 3   vitamin B-12 (CYANOCOBALAMIN) 1000 MCG tablet Take 1,000 mcg by mouth daily.     No current facility-administered medications for this visit.    No Known Allergies   REVIEW OF SYSTEMS:  [X]  denotes positive finding, [ ]  denotes negative finding Cardiac  Comments:  Chest pain or chest pressure:    Shortness of breath upon exertion:    Short of breath when lying flat:    Irregular heart rhythm:        Vascular    Pain in calf, thigh, or hip brought on by ambulation:    Pain in feet at night that wakes you up from your sleep:     Blood clot in your veins:    Leg swelling:         Pulmonary    Oxygen at home:    Productive cough:     Wheezing:         Neurologic    Sudden weakness in arms or legs:     Sudden numbness in arms or legs:     Sudden onset of difficulty speaking or slurred speech:    Temporary loss of vision in one eye:     Problems with dizziness:         Gastrointestinal    Blood in stool:     Vomited blood:         Genitourinary    Burning when urinating:     Blood in urine:        Psychiatric    Major depression:         Hematologic    Bleeding problems:    Problems with blood clotting too easily:        Skin    Rashes or ulcers:        Constitutional    Fever or chills:      PHYSICAL EXAMINATION:  Vitals:   04/17/23 1015  BP: (!) 151/95  Pulse: 96  Resp: 16  Temp: 98.5 F (36.9  C)  TempSrc: Temporal  SpO2: 96%  Weight: 234 lb (106.1 kg)  Height: 5' 11.5" (1.816 m)    General:  WDWN in NAD; vital signs documented above Gait: Normal HENT: WNL, normocephalic Pulmonary: normal non-labored breathing , without  wheezing Cardiac: regular HR Abdomen: soft, NT, Normal bowel sounds, no palpable Aortic pulse Vascular Exam/Pulses:2+ radial,  2+ femoral, 1+ right DP, 2+ Dp on left, and 2+ PT pulses bilaterally Extremities: without ischemic changes, without Gangrene , without cellulitis; without open wounds;  Musculoskeletal: no muscle wasting or atrophy  Neurologic: A&O X 3 Psychiatric:  The pt has Normal affect.   Non-Invasive Vascular Imaging:   Endovascular Aortic Repair (EVAR):  +----------+----------------+-------------------+-------------------+           Diameter AP (cm)Diameter Trans (cm)Velocities (cm/sec)  +----------+----------------+-------------------+-------------------+  Aorta    5.92            6.50               57                   +----------+----------------+-------------------+-------------------+  Right Limb3.03            2.45               57                   +----------+----------------+-------------------+-------------------+  Left Limb 1.49            1.63               89                   +----------+----------------+-------------------+-------------------+   Summary:  Abdominal Aorta: Patent endovascular aneurysm repair with no evidence of endoleak. Previous diameter measurement was 5.9 x 6.4 cm obtained on 04/04/22.    ASSESSMENT/PLAN:: 67 y.o. male here for follow up for EVAR. He is not having any related back or abdominal pain. He is without claudication, rest pain or tissue loss. His duplex today shows that there is an increase in the right limb of the EVAR compared to the duplex last year with max diameter of 3.0 cm compared to 1.7 cm. Comparing this to prior CT in 2022 this appears stable however with the  change on duplex I have recommended further imaging to make sure there is no endoleak.  - Continue adequate blood pressure control - Continue Aspirin and statin - encourage smoking cessation - Will schedule CTA abd/ pelvis (stent graft protocol) in the near future - Will call patient with the results of his CTA. If any intervention is indicated I will arrange follow up with MD - If his CTA shows everything is stable without endoleak he will follow up in 1 year with repeat EVAR duplex   Graceann Congress, PA-C [facility] [phone]  Clinic MD:   [place]/ Randie Heinz

## 2023-04-21 ENCOUNTER — Other Ambulatory Visit: Payer: Self-pay

## 2023-04-21 DIAGNOSIS — Z9889 Other specified postprocedural states: Secondary | ICD-10-CM

## 2023-05-09 ENCOUNTER — Telehealth: Payer: Self-pay | Admitting: *Deleted

## 2023-05-09 NOTE — Telephone Encounter (Signed)
   Pre-operative Risk Assessment    Patient Name: Benjamin Preston  DOB: 28-Jul-1956 MRN: [id number]      Request for Surgical Clearance    Procedure:   LEFT TOTAL ANTERIOR HIP ARTHROPLASTY   Date of Surgery:  Clearance TBD                                 Surgeon:  DR. Gean Birchwood Surgeon's Group or Practice Name:  [facility] Phone number:  [phone] ATTN: REBECCA LANG Fax number:  [phone]   Type of Clearance Requested:   - Medical ; NO MEDICATIONS ARE NEEDING TO BE HELD   Type of Anesthesia:  Spinal   Additional requests/questions:    Elpidio Anis   05/09/2023, 11:19 AM

## 2023-05-09 NOTE — Telephone Encounter (Signed)
Pt has been scheduled with Benjamin Preston, PAC for pre op clearance. Pt states trying to get surgery done either by end of July or sometime in August. Pt will keep his yrly f/u with Dr. Eldridge Dace. I will update all parties involved.

## 2023-05-09 NOTE — Telephone Encounter (Signed)
    Primary Cardiologist:Jayadeep Eldridge Dace, MD  Chart reviewed as part of pre-operative protocol coverage. Because of Aja Baus's past medical history and time since last visit, he/she will require a follow-up visit in order to better assess preoperative cardiovascular risk.  Pre-op covering staff: - Please schedule Office  appointment and call patient to inform them. - Please contact requesting surgeon's office via preferred method (i.e, phone, fax) to inform them of need for appointment prior to surgery.  If applicable, this message will also be routed to pharmacy pool and/or primary cardiologist for input on holding anticoagulant/antiplatelet agent as requested below so that this information is available at time of patient's appointment.   Ronney Asters, NP  05/09/2023, 11:36 AM

## 2023-05-19 DIAGNOSIS — Z01810 Encounter for preprocedural cardiovascular examination: Secondary | ICD-10-CM | POA: Insufficient documentation

## 2023-05-19 NOTE — Progress Notes (Unsigned)
Cardiology Office Note:    Date:  05/20/2023  ID:  Renald Carnell, DOB Sep 09, 1956, MRN [id number] PCP: Blair Heys, MD  [facility] Providers Cardiologist:  Lance Muss, MD       Patient Profile:      Abdominal aortic aneurysm S/p EVAR in 07/2020 (Dr. Darrick Penna) Hypertension  Hyperlipidemia  Tobacco use  Aortic atherosclerosis       History of Present Illness:   Benjamin Preston is a 67 y.o. male who returns for surgical clearance.  He was last seen by Dr. Eldridge Dace in February 2023.  He needs left total hip arthroplasty with Dr. Turner Daniels under spinal anesthesia.  He is here alone.  He is doing well without chest pain, shortness of breath, syncope, orthopnea, leg edema.  His hip limits his mobility but he is still able to go up a flight of stairs or walk couple of blocks.  He did pick up smoking again but has quit about a week or 2 ago.  He is not regularly checking his blood pressures at home.  ROS See HPI    Studies Reviewed:   EKG Interpretation Date/Time:  Tuesday May 20 2023 08:55:20 EDT Ventricular Rate:  69 PR Interval:  176 QRS Duration:  84 QT Interval:  412 QTC Calculation: 441 R Axis:   0  Text Interpretation: Normal sinus rhythm Normal ECG Confirmed by Tereso Newcomer [phone]) on 05/20/2023 9:12:01 AM   Risk Assessment/Calculations:     HYPERTENSION CONTROL Vitals:   05/20/23 0843 05/20/23 0932  BP: (!) 143/98 (!) 140/94    The patient's blood pressure is elevated above target today.  In order to address the patient's elevated BP: A current anti-hypertensive medication was adjusted today.; Blood pressure will be monitored at home to determine if medication changes need to be made.          Physical Exam:   VS:  BP (!) 140/94   Pulse 69   Ht 5\' 11"  (1.803 m)   Wt 229 lb 3.2 oz (104 kg)   SpO2 97%   BMI 31.97 kg/m    Wt Readings from Last 3 Encounters:  05/20/23 229 lb 3.2 oz (104 kg)  04/17/23 234 lb (106.1 kg)  04/04/22 255 lb 14.4 oz (116.1 kg)     Constitutional:      Appearance: Healthy appearance. Not in distress.  Neck:     Vascular: No carotid bruit. JVD normal.  Pulmonary:     Breath sounds: Normal breath sounds. No wheezing. No rales.  Cardiovascular:     Normal rate. Regular rhythm.     Murmurs: There is no murmur.  Edema:    Peripheral edema absent.  Abdominal:     Palpations: Abdomen is soft.      ASSESSMENT AND PLAN:   Preoperative cardiovascular examination Mr. Espericueta's perioperative risk of a major cardiac event is 0.4% according to the Revised Cardiac Risk Index (RCRI).  Therefore, he is at low risk for perioperative complications.   His functional capacity is good at 4.31 METs according to the Duke Activity Status Index (DASI). Recommendations: According to ACC/AHA guidelines, no further cardiovascular testing needed.  The patient may proceed to surgery at acceptable risk.    AAA (abdominal aortic aneurysm) without rupture (HCC) Continue follow up with vascular surgery. He has a follow up CT scan tomorrow.   Essential hypertension BP uncontrolled. Increase Metoprolol succinate to 50 mg once daily. Monitor BP x 2 weeks.   Hypercholesterolemia Labs from primary care reviewed. LDL  in 03/2023 was optimal at 55.  Continue Crestor 20 mg daily.    Dispo:  Return in about 1 year (around 05/19/2024) for Routine Follow Up w/ Dr. Eldridge Dace.  Signed, Tereso Newcomer, PA-C

## 2023-05-20 ENCOUNTER — Ambulatory Visit: Payer: BC Managed Care – PPO | Attending: Physician Assistant | Admitting: Physician Assistant

## 2023-05-20 ENCOUNTER — Encounter: Payer: Self-pay | Admitting: Physician Assistant

## 2023-05-20 VITALS — BP 140/94 | HR 69 | Ht 71.0 in | Wt 229.2 lb

## 2023-05-20 DIAGNOSIS — Z01810 Encounter for preprocedural cardiovascular examination: Secondary | ICD-10-CM | POA: Diagnosis not present

## 2023-05-20 DIAGNOSIS — I7143 Infrarenal abdominal aortic aneurysm, without rupture: Secondary | ICD-10-CM

## 2023-05-20 DIAGNOSIS — I10 Essential (primary) hypertension: Secondary | ICD-10-CM | POA: Diagnosis not present

## 2023-05-20 DIAGNOSIS — E7800 Pure hypercholesterolemia, unspecified: Secondary | ICD-10-CM

## 2023-05-20 MED ORDER — METOPROLOL SUCCINATE ER 50 MG PO TB24: 50.0000 mg | ORAL_TABLET | Freq: Every day | ORAL | 3 refills | Status: DC

## 2023-05-20 NOTE — Assessment & Plan Note (Signed)
Labs from primary care reviewed. LDL in 03/2023 was optimal at 55.  Continue Crestor 20 mg daily.

## 2023-05-20 NOTE — Assessment & Plan Note (Signed)
BP uncontrolled. Increase Metoprolol succinate to 50 mg once daily. Monitor BP x 2 weeks.

## 2023-05-20 NOTE — Assessment & Plan Note (Addendum)
Benjamin Preston perioperative risk of a major cardiac event is 0.4% according to the Revised Cardiac Risk Index (RCRI).  Therefore, he is at low risk for perioperative complications.   His functional capacity is good at 4.31 METs according to the Duke Activity Status Index (DASI). Recommendations: According to ACC/AHA guidelines, no further cardiovascular testing needed.  The patient may proceed to surgery at acceptable risk.

## 2023-05-20 NOTE — Assessment & Plan Note (Signed)
Continue follow up with vascular surgery. He has a follow up CT scan tomorrow.

## 2023-05-20 NOTE — Patient Instructions (Signed)
Medication Instructions:  Your physician has recommended you make the following change in your medication:  INCREASE Toprol XL 50mg  by mouth once daily.   *If you need a refill on your cardiac medications before your next appointment, please call your pharmacy*   Lab Work: None  If you have labs (blood work) drawn today and your tests are completely normal, you will receive your results only by: MyChart Message (if you have MyChart) OR A paper copy in the mail If you have any lab test that is abnormal or we need to change your treatment, we will call you to review the results.   Testing/Procedures: None   Follow-Up: At [facility], you and your health needs are our priority.  As part of our continuing mission to provide you with exceptional heart care, we have created designated Provider Care Teams.  These Care Teams include your primary Cardiologist (physician) and Advanced Practice Providers (APPs -  Physician Assistants and Nurse Practitioners) who all work together to provide you with the care you need, when you need it.  We recommend signing up for the patient portal called "MyChart".  Sign up information is provided on this After Visit Summary.  MyChart is used to connect with patients for Virtual Visits (Telemedicine).  Patients are able to view lab/test results, encounter notes, upcoming appointments, etc.  Non-urgent messages can be sent to your provider as well.   To learn more about what you can do with MyChart, go to [email or web address].    Your next appointment:   1 year(s)  Provider:   Lance Muss, MD     Other Instructions Your physician has requested that you regularly monitor and record your blood pressure readings at home. Please use the same machine at the same time of day to check your readings and record them to bring to your follow-up visit.   Please monitor blood pressures and keep a log of your readings for 2 weeks and then send a message  through MyChart.    Make sure to check 2 hours after your medications.    AVOID these things for 30 minutes before checking your blood pressure: No Drinking caffeine. No Drinking alcohol. No Eating. No Smoking. No Exercising.   Five minutes before checking your blood pressure: Pee. Sit in a dining chair. Avoid sitting in a soft couch or armchair. Be quiet. Do not talk

## 2023-05-21 ENCOUNTER — Ambulatory Visit
Admission: RE | Admit: 2023-05-21 | Discharge: 2023-05-21 | Disposition: A | Discharge status: Home / Self Care | Payer: BC Managed Care – PPO | Source: Ambulatory Visit | Attending: Vascular Surgery | Admitting: Vascular Surgery

## 2023-05-21 DIAGNOSIS — Z9889 Other specified postprocedural states: Secondary | ICD-10-CM

## 2023-05-21 MED ORDER — IOPAMIDOL (ISOVUE-370) INJECTION 76%
75.0000 mL | Freq: Once | INTRAVENOUS | Status: AC | PRN
  Administered 2023-05-21: 75 mL via INTRAVENOUS

## 2023-05-26 NOTE — Progress Notes (Signed)
Fax received from [facility] on 05/09/23 for medical clearance/medication hold for left total anterior hip arthroplasty to be signed by Dr. Karin Lieu.  Provider signed on 05/16/23, form faxed back to sender on 05/16/23, verified successful, sent to scan center.

## 2023-08-04 ENCOUNTER — Ambulatory Visit: Payer: BC Managed Care – PPO | Admitting: Interventional Cardiology

## 2023-10-02 ENCOUNTER — Encounter: Payer: Self-pay | Admitting: Family Medicine

## 2023-10-27 NOTE — Progress Notes (Unsigned)
Benjamin Preston   [phone] [phone]   Clinic New consult Note   Patient Care Team: Blair Heys, MD (Inactive) as PCP - General (Family Medicine) Corky Crafts, MD as PCP - Cardiology (Cardiology) 10/27/2023  CHIEF COMPLAINTS/PURPOSE OF CONSULTATION:  ***Anemia  ASSESSMENT & PLAN:  Benjamin Preston is a 67 y.o. *** male with a history of ***  1. *** Anemia ***   PLAN: ***   HISTORY OF PRESENTING ILLNESS:  Benjamin Preston 67 y.o. male is here because of *** anemia.  ***He was found to have abnormal CBC from *** ***He denies recent chest pain on exertion, shortness of breath on minimal exertion, pre-syncopal episodes, or palpitations. ***He had not noticed any recent bleeding such as epistaxis, hematuria or hematochezia ***The patient denies over the counter NSAID ingestion. He is not *** on antiplatelets agents. His last colonoscopy was *** ***He had no prior history or diagnosis of cancer. His age appropriate screening programs are up-to-date. ***He denies any pica and eats a variety of diet. ***He never donated blood or received blood transfusion ***The patient was prescribed oral iron supplements and he takes ***   REVIEW OF SYSTEMS:   Constitutional: Denies fevers, chills or abnormal night sweats Eyes: Denies blurriness of vision, double vision or watery eyes Ears, nose, mouth, throat, and face: Denies mucositis or sore throat Respiratory: Denies cough, dyspnea or wheezes Cardiovascular: Denies palpitation, chest discomfort or lower extremity swelling Gastrointestinal:  Denies nausea, heartburn or change in bowel habits Skin: Denies abnormal skin rashes Lymphatics: Denies new lymphadenopathy or easy bruising Neurological:Denies numbness, tingling or new weaknesses Behavioral/Psych: Mood is stable, no new changes   All other systems were reviewed with the patient and are negative.   MEDICAL HISTORY:  Past Medical  History:  Diagnosis Date   AAA (abdominal aortic aneurysm) (HCC)    Adenomatous colon polyp    Anxiety    Depression    GERD (gastroesophageal reflux disease)    Headache, cluster, episodic    Hematuria    Hypercholesterolemia    Hyperlipidemia    Hypertension    Recovering alcoholic (HCC)     SURGICAL HISTORY: Past Surgical History:  Procedure Laterality Date   ABDOMINAL AORTIC ENDOVASCULAR STENT GRAFT N/A 08/15/2020   Procedure: ABDOMINAL AORTIC ENDOVASCULAR STENT GRAFT;  Surgeon: Sherren Kerns, MD;  Location: [facility] OR;  Service: Vascular;  Laterality: N/A;   EMBOLIZATION (CATH LAB) Right 07/21/2020   Procedure: EMBOLIZATION;  Surgeon: Sherren Kerns, MD;  Location: [facility] INVASIVE CV LAB;  Service: Cardiovascular;  Laterality: Right;  external iliac   FEMORAL ARTERY EXPLORATION Right 08/15/2020   Procedure: RIGHT FEMORAL ARTERY EXPLORATION AND REPAIR;  Surgeon: Sherren Kerns, MD;  Location: [facility] OR;  Service: Vascular;  Laterality: Right;   TENDON GRAFT     TONSILLECTOMY     ULTRASOUND GUIDANCE FOR VASCULAR ACCESS Bilateral 08/15/2020   Procedure: ULTRASOUND GUIDANCE FOR VASCULAR ACCESS;  Surgeon: Sherren Kerns, MD;  Location: [facility] OR;  Service: Vascular;  Laterality: Bilateral;    SOCIAL HISTORY: Social History   Socioeconomic History   Marital status: Married    Spouse name: Not on file   Number of children: Not on file   Years of education: Not on file   Highest education level: Not on file  Occupational History   Not on file  Tobacco Use   Smoking status: Former    Current packs/day: 0.00    Types: Cigarettes    Quit date: 09/10/2020  Years since quitting: 3.1    Passive exposure: Never   Smokeless tobacco: Never  Vaping Use   Vaping status: Never Used  Substance and Sexual Activity   Alcohol use: Not Currently   Drug use: Never   Sexual activity: Not on file  Other Topics Concern   Not on file  Social History Narrative   Not on file   Social  Determinants of Health   Financial Resource Strain: Not on file  Food Insecurity: Not on file  Transportation Needs: Not on file  Physical Activity: Not on file  Stress: Not on file  Social Connections: Not on file  Intimate Partner Violence: Not on file    FAMILY HISTORY: Family History  Problem Relation Age of Onset   Mesothelioma Mother    Diverticulitis Mother    Prostate cancer Father     ALLERGIES:  has No Known Allergies.  MEDICATIONS:  Current Outpatient Medications  Medication Sig Dispense Refill   ALPRAZolam (XANAX) 0.5 MG tablet Take 0.25-0.5 mg by mouth daily as needed for anxiety.      aspirin-sod bicarb-citric acid (ALKA-SELTZER) 325 MG TBEF tablet Take 325 mg by mouth every 6 (six) hours as needed (indigestion/heartburn/upset stomach).     calcium carbonate (TUMS - DOSED IN MG ELEMENTAL CALCIUM) 500 MG chewable tablet Chew 2-3 tablets by mouth 3 (three) times daily as needed for indigestion or heartburn.     escitalopram (LEXAPRO) 10 MG tablet Take 10 mg by mouth daily.     finasteride (PROSCAR) 5 MG tablet Take 5 mg by mouth daily.     ibuprofen (ADVIL) 200 MG tablet Take 200-400 mg by mouth every 6 (six) hours as needed (pain.).     iron polysaccharides (NIFEREX) 150 MG capsule Take 150 mg by mouth daily.     methocarbamol (ROBAXIN) 500 MG tablet Take 500-1,000 mg by mouth every 6 (six) hours as needed.     metoprolol succinate (TOPROL XL) 50 MG 24 hr tablet Take 1 tablet (50 mg total) by mouth daily. Take with or immediately following a meal. 90 tablet 3   nicotine (NICOTROL) 10 MG inhaler Inhale 1 continuous puffing into the lungs as needed for smoking cessation.     omeprazole (PRILOSEC) 40 MG capsule Take 1 capsule by mouth daily.     rosuvastatin (CRESTOR) 20 MG tablet Take 1 tablet (20 mg total) by mouth daily. 90 tablet 3   vitamin B-12 (CYANOCOBALAMIN) 1000 MCG tablet Take 1,000 mcg by mouth daily.     No current facility-administered medications for  this visit.    PHYSICAL EXAMINATION: ECOG PERFORMANCE STATUS: {CHL ONC ECOG PS:[id number]}  There were no vitals filed for this visit. There were no vitals filed for this visit.  GENERAL:alert, no distress and comfortable SKIN: skin color, texture, turgor are normal, no rashes or significant lesions EYES: normal, conjunctiva are pink and non-injected, sclera clear OROPHARYNX:no exudate, no erythema and lips, buccal mucosa, and tongue normal  NECK: supple, thyroid normal size, non-tender, without nodularity LYMPH:  no palpable lymphadenopathy in the cervical, axillary or inguinal LUNGS: clear to auscultation and percussion with normal breathing effort HEART: regular rate & rhythm and no murmurs and no lower extremity edema ABDOMEN:abdomen soft, non-tender and normal bowel sounds Musculoskeletal:no cyanosis of digits and no clubbing  PSYCH: alert & oriented x 3 with fluent speech NEURO: no focal motor/sensory deficits  LABORATORY DATA:  I have reviewed the data as listed    Latest Ref Rng & Units 08/16/2020  4:06 AM 08/15/2020    1:00 PM 08/14/2020   12:00 PM  CBC  WBC 4.0 - 10.5 K/uL 10.4  8.4  6.4   Hemoglobin 13.0 - 17.0 g/dL 09.8  11.9  14.7   Hematocrit 39.0 - 52.0 % 33.8  35.5  42.4   Platelets 150 - 400 K/uL 109  PLATELET CLUMPS NOTED ON SMEAR, UNABLE TO ESTIMATE  139        Latest Ref Rng & Units 08/16/2020    4:06 AM 08/15/2020    1:00 PM 08/14/2020   12:00 PM  CMP  Glucose 70 - 99 mg/dL 829  562  130   BUN 8 - 23 mg/dL 16  12  15    Creatinine 0.61 - 1.24 mg/dL 8.65  7.84  6.96   Sodium 135 - 145 mmol/L 138  136  138   Potassium 3.5 - 5.1 mmol/L 4.5  4.4  4.5   Chloride 98 - 111 mmol/L 107  107  106   CO2 22 - 32 mmol/L 23  23  23    Calcium 8.9 - 10.3 mg/dL 8.5  8.3  9.4   Total Protein 6.5 - 8.1 g/dL   6.6   Total Bilirubin 0.3 - 1.2 mg/dL   0.4   Alkaline Phos 38 - 126 U/L   44   AST 15 - 41 U/L   25   ALT 0 - 44 U/L   27      RADIOGRAPHIC STUDIES: I  have personally reviewed the radiological images as listed and agreed with the findings in the report. No results found.  No orders of the defined types were placed in this encounter.   All questions were answered. The patient knows to call the clinic with any problems, questions or concerns. The total time spent in the appointment was {CHL ONC TIME VISIT - EXBMW:[id number]}.     Carlean Jews, NP 10/27/2023 6:14 PM  I, Mickie Bail, am acting as scribe for Malachy Mood, MD.   {Add scribe attestation statement}   Addendum I have seen the patient, examined him. I agree with the assessment and and plan and have edited the notes.   67 yo male with PMH of hypertension, dyslipidemia, anxiety and depression, abdominal aortic aneurysm, and history of heavy alcohol drinking more than 30 years ago, was referred for intermittent mild thrombocytopenia.  His platelet count has been above 100K, no significant bleeding history.  He is WBC and hematocrit has been normal most of time.  I discussed potential etiology of his mild thrombocytopenia, such as ITP, alcohol-related thrombocytopenia, liver disease, splenomegaly, medication induced, B12 or folate deficiency, chronic infection such as hepatitis C and HIV, and bone marrow disease such as MDS. The other etiology such as infection, malignancy, microangiopathy, DIC a less likely given the indolent course and his clinical presentation. His CT from 05/2023 showed normal liver and spleen. No history of hepatitis. Will repeat his CBC today and check immature platelet, folate, B12 and iron study today. Will call him with lab results. All questions were answered.  I spent a total of 30 minutes for his visit today, more than 50% time on face-to-face counseling.   Malachy Mood MD 10/28/2023

## 2023-10-28 ENCOUNTER — Inpatient Hospital Stay: Payer: BC Managed Care – PPO | Attending: Nurse Practitioner | Admitting: Nurse Practitioner

## 2023-10-28 ENCOUNTER — Encounter: Payer: Self-pay | Admitting: Nurse Practitioner

## 2023-10-28 ENCOUNTER — Inpatient Hospital Stay: Payer: BC Managed Care – PPO

## 2023-10-28 VITALS — BP 139/89 | HR 70 | Temp 97.6°F | Resp 17 | Wt 225.6 lb

## 2023-10-28 DIAGNOSIS — Z87891 Personal history of nicotine dependence: Secondary | ICD-10-CM | POA: Insufficient documentation

## 2023-10-28 DIAGNOSIS — D696 Thrombocytopenia, unspecified: Secondary | ICD-10-CM | POA: Diagnosis present

## 2023-10-28 DIAGNOSIS — D509 Iron deficiency anemia, unspecified: Secondary | ICD-10-CM | POA: Insufficient documentation

## 2023-10-28 LAB — CBC WITH DIFFERENTIAL (CANCER CENTER ONLY)
Abs Immature Granulocytes: 0.01 10*3/uL (ref 0.00–0.07)
Basophils Absolute: 0.1 10*3/uL (ref 0.0–0.1)
Basophils Relative: 1 %
Eosinophils Absolute: 0.2 10*3/uL (ref 0.0–0.5)
Eosinophils Relative: 2 %
HCT: 44 % (ref 39.0–52.0)
Hemoglobin: 15.2 g/dL (ref 13.0–17.0)
Immature Granulocytes: 0 %
Lymphocytes Relative: 14 %
Lymphs Abs: 1 10*3/uL (ref 0.7–4.0)
MCH: 31.2 pg (ref 26.0–34.0)
MCHC: 34.5 g/dL (ref 30.0–36.0)
MCV: 90.3 fL (ref 80.0–100.0)
Monocytes Absolute: 0.8 10*3/uL (ref 0.1–1.0)
Monocytes Relative: 11 %
Neutro Abs: 4.8 10*3/uL (ref 1.7–7.7)
Neutrophils Relative %: 72 %
Platelet Count: 124 10*3/uL — ABNORMAL LOW (ref 150–400)
RBC: 4.87 MIL/uL (ref 4.22–5.81)
RDW: 13.6 % (ref 11.5–15.5)
Smear Review: NORMAL
WBC Count: 6.7 10*3/uL (ref 4.0–10.5)
nRBC: 0 % (ref 0.0–0.2)

## 2023-10-28 LAB — FOLATE: Folate: 8.1 ng/mL (ref >5.9)

## 2023-10-28 LAB — VITAMIN B12: Vitamin B-12: 351 pg/mL (ref 180–914)

## 2023-10-28 LAB — SAVE SMEAR(SSMR), FOR PROVIDER SLIDE REVIEW

## 2023-10-28 LAB — IMMATURE PLATELET FRACTION: Immature Platelet Fraction: 3.1 % (ref 1.2–8.6)

## 2023-10-29 LAB — FERRITIN: Ferritin: 34 ng/mL (ref 24–336)

## 2023-10-29 NOTE — Assessment & Plan Note (Addendum)
This has been an intermittent issue since 2021.  At that time, he went to see his primary care because of an episode of significant blood in the urine.  A CT scan was done which found the bladder and kidneys clear, but discovered he had an abdominal aortic aneurysm.  This was repaired in September 2021.  This was first episode of known thrombocytopenia.  Platelets had dropped to 109 with some clumping following his AAA repair.  Hemoglobin was 11.3 and hematocrit was 33.8.  Subsequent checks showed normalized CBC.  09/03/2021 and 09/13/2021, CBCs were both normal.  Colonoscopy was performed in 10/07/2021.  He had an adenomatous polyp.  Repeat expected in 5 years.In early 2023, patient started having trouble with epigastric pain.  Was seen by his GI provider.  Labs showed platelet count of 129.  Hemoglobin was 12.5 and hematocrit was 39.0.  He had EGD done which was positive for duodenal ulcers in May 2023.  Was started on medication to help with acid reflux symptoms.  CBC then rebounded with platelet count of 149, hemoglobin 13.1, and hematocrit 40.2.  On 03/17/2023, prior to hip replacement surgery, patient had a normal CBC.  His platelets were 153, hemoglobin 14.4, and hematocrit 43.2.  Recent physical with his primary care on 09/30/2023, platelets were back down to 133.  His hemoglobin is normal at 15.0 and hematocrit is normal at 44.4.  Between April and November 2024, patient did have hip placement surgery.  Looking back, each episode of leukocytopenia, every episode was preceded by a surgery or illness.  Platelets remain> 100 and generally<150.  He was checked for hep otitis C August 2020, which was negative.  Liver functions have been historically normal despite abnormal CBC. 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  Historical imaging of liver has been normal.  Most recently, imaged and July 2024.  His thrombocytopenia is very mild with platelets  never dropping below 100.  This is likely something we will continue to monitor.  Advised him of signs and symptoms of overt bleeding as well as more subtle signs of bleeding.  He and his wife both voiced understanding. Today we will check labs which include CBC with differential, immature platelet count, B12, ferritin, save smear, and folate.  Will notify him of results via MyChart message.  Plan to monitor his labs every 6 months with a follow-up in 12 months.

## 2024-04-19 ENCOUNTER — Other Ambulatory Visit: Payer: Self-pay

## 2024-04-19 DIAGNOSIS — I714 Abdominal aortic aneurysm, without rupture, unspecified: Secondary | ICD-10-CM

## 2024-04-26 ENCOUNTER — Telehealth: Payer: Self-pay | Admitting: Nurse Practitioner

## 2024-04-27 ENCOUNTER — Other Ambulatory Visit: Payer: BC Managed Care – PPO

## 2024-05-06 ENCOUNTER — Ambulatory Visit: Payer: Self-pay | Attending: Vascular Surgery | Admitting: Physician Assistant

## 2024-05-06 ENCOUNTER — Ambulatory Visit (HOSPITAL_COMMUNITY)
Admission: RE | Admit: 2024-05-06 | Discharge: 2024-05-06 | Disposition: A | Discharge status: Home / Self Care | Payer: Self-pay | Source: Ambulatory Visit | Attending: Vascular Surgery | Admitting: Vascular Surgery

## 2024-05-06 ENCOUNTER — Other Ambulatory Visit: Payer: Self-pay | Admitting: Physician Assistant

## 2024-05-06 VITALS — BP 154/89 | HR 55 | Temp 98.2°F | Ht 71.0 in | Wt 234.5 lb

## 2024-05-06 DIAGNOSIS — I714 Abdominal aortic aneurysm, without rupture, unspecified: Secondary | ICD-10-CM

## 2024-05-06 DIAGNOSIS — Z9889 Other specified postprocedural states: Secondary | ICD-10-CM | POA: Diagnosis not present

## 2024-05-06 NOTE — Progress Notes (Signed)
 HISTORY AND PHYSICAL     CC:  follow up. For EVAR Requesting Provider:  No ref. provider found  HPI: This is a 68 y.o. male who is here today for follow up for AAA and is s/p aortogram with coil embolization of right IIA aneurysm on 07/21/2020 by Dr. Nolene Baumgarten and EVAR on  08/15/2020 by Dr. Nolene Baumgarten.  Pt was last seen 04/17/2023 and at that time, he was not having any new back or abdominal pain.  He was continuing to have left hip pain and felt he was going to need a left hip replacement in the future.  He did not have any claudication, rest pain or non healing wounds.   The pt returns today for follow up studies and here with his wife.  He states he has been doing well. He denies any new abdominal or back pain.  He does not have any claudication or rest pain or non healing wounds.  He is compliant with his statin.  He is not on asa due to hx of bleeding.  He feels he is going to need hip replacement on the right in the near future.   He has started smoking again.    The pt is on a statin for cholesterol management.    The pt is is not on an aspirin  (hx of bleeding).    Other AC:  none The pt is on BB for hypertension.  The pt is not on medication for diabetes. Tobacco hx:  former  Pt does not have family hx of AAA.  Past Medical History:  Diagnosis Date   AAA (abdominal aortic aneurysm) (HCC)    Adenomatous colon polyp    Anxiety    Depression    GERD (gastroesophageal reflux disease)    Headache, cluster, episodic    Hematuria    Hypercholesterolemia    Hyperlipidemia    Hypertension    Recovering alcoholic (HCC)     Past Surgical History:  Procedure Laterality Date   ABDOMINAL AORTIC ENDOVASCULAR STENT GRAFT N/A 08/15/2020   Procedure: ABDOMINAL AORTIC ENDOVASCULAR STENT GRAFT;  Surgeon: Richrd Char, MD;  Location: [facility] OR;  Service: Vascular;  Laterality: N/A;   EMBOLIZATION (CATH LAB) Right 07/21/2020   Procedure: EMBOLIZATION;  Surgeon: Richrd Char, MD;  Location: [facility]  INVASIVE CV LAB;  Service: Cardiovascular;  Laterality: Right;  external iliac   FEMORAL ARTERY EXPLORATION Right 08/15/2020   Procedure: RIGHT FEMORAL ARTERY EXPLORATION AND REPAIR;  Surgeon: Richrd Char, MD;  Location: [facility] OR;  Service: Vascular;  Laterality: Right;   TENDON GRAFT     TONSILLECTOMY     ULTRASOUND GUIDANCE FOR VASCULAR ACCESS Bilateral 08/15/2020   Procedure: ULTRASOUND GUIDANCE FOR VASCULAR ACCESS;  Surgeon: Richrd Char, MD;  Location: [facility] OR;  Service: Vascular;  Laterality: Bilateral;    No Known Allergies  Current Outpatient Medications  Medication Sig Dispense Refill   ALPRAZolam  (XANAX ) 0.5 MG tablet Take 0.25-0.5 mg by mouth daily as needed for anxiety.      aspirin -sod bicarb-citric acid (ALKA-SELTZER) 325 MG TBEF tablet Take 325 mg by mouth every 6 (six) hours as needed (indigestion/heartburn/upset stomach).     calcium  carbonate (TUMS - DOSED IN MG ELEMENTAL CALCIUM ) 500 MG chewable tablet Chew 2-3 tablets by mouth 3 (three) times daily as needed for indigestion or heartburn.     escitalopram  (LEXAPRO ) 10 MG tablet Take 10 mg by mouth daily.     finasteride  (PROSCAR ) 5 MG tablet Take 5 mg by  mouth daily.     ibuprofen (ADVIL) 200 MG tablet Take 200-400 mg by mouth every 6 (six) hours as needed (pain.).     iron polysaccharides (NIFEREX) 150 MG capsule Take 150 mg by mouth daily.     metoprolol  succinate (TOPROL  XL) 50 MG 24 hr tablet Take 1 tablet (50 mg total) by mouth daily. Take with or immediately following a meal. 90 tablet 3   nicotine (NICOTROL) 10 MG inhaler Inhale 1 continuous puffing into the lungs as needed for smoking cessation.     omeprazole (PRILOSEC) 40 MG capsule Take 1 capsule by mouth daily.     rosuvastatin  (CRESTOR ) 20 MG tablet Take 1 tablet (20 mg total) by mouth daily. 90 tablet 3   vitamin B-12 (CYANOCOBALAMIN ) 1000 MCG tablet Take 1,000 mcg by mouth daily.     No current facility-administered medications for this visit.     Family History  Problem Relation Age of Onset   Mesothelioma Mother    Diverticulitis Mother    Prostate cancer Father     Social History   Socioeconomic History   Marital status: Married    Spouse name: Not on file   Number of children: Not on file   Years of education: Not on file   Highest education level: Not on file  Occupational History   Not on file  Tobacco Use   Smoking status: Former    Current packs/day: 0.00    Types: Cigarettes    Quit date: 09/10/2020    Years since quitting: 3.6    Passive exposure: Never   Smokeless tobacco: Never  Vaping Use   Vaping status: Never Used  Substance and Sexual Activity   Alcohol use: Not Currently   Drug use: Never   Sexual activity: Not on file  Other Topics Concern   Not on file  Social History Narrative   Not on file   Social Drivers of Health   Financial Resource Strain: Not on file  Food Insecurity: No Food Insecurity (10/28/2023)   Hunger Vital Sign    Worried About Running Out of Food in the Last Year: Never true    Ran Out of Food in the Last Year: Never true  Transportation Needs: No Transportation Needs (10/28/2023)   PRAPARE - Administrator, Civil Service (Medical): No    Lack of Transportation (Non-Medical): No  Physical Activity: Not on file  Stress: Not on file  Social Connections: Not on file  Intimate Partner Violence: Not At Risk (10/28/2023)   Humiliation, Afraid, Rape, and Kick questionnaire    Fear of Current or Ex-Partner: No    Emotionally Abused: No    Physically Abused: No    Sexually Abused: No     REVIEW OF SYSTEMS:   [X]  denotes positive finding, [ ]  denotes negative finding Cardiac  Comments:  Chest pain or chest pressure:    Shortness of breath upon exertion:    Short of breath when lying flat:    Irregular heart rhythm:        Vascular    Pain in calf, thigh, or hip brought on by ambulation:    Pain in feet at night that wakes you up from your sleep:      Blood clot in your veins:    Leg swelling:         Pulmonary    Oxygen at home:    Productive cough:     Wheezing:  Neurologic    Sudden weakness in arms or legs:     Sudden numbness in arms or legs:     Sudden onset of difficulty speaking or slurred speech:    Temporary loss of vision in one eye:     Problems with dizziness:         Gastrointestinal    Blood in stool:     Vomited blood:         Genitourinary    Burning when urinating:     Blood in urine:        Psychiatric    Major depression:         Hematologic    Bleeding problems:    Problems with blood clotting too easily:        Skin    Rashes or ulcers:        Constitutional    Fever or chills:      PHYSICAL EXAMINATION:  Today's Vitals   05/06/24 1119  BP: (!) 154/89  Pulse: (!) 55  Temp: 98.2 F (36.8 C)  TempSrc: Temporal  SpO2: 94%  Weight: 234 lb 8 oz (106.4 kg)  Height: 5' 11 (1.803 m)  PainSc: 0-No pain   Body mass index is 32.71 kg/m.   General:  WDWN in NAD; vital signs documented above Gait: Not observed HENT: WNL, normocephalic Pulmonary: normal non-labored breathing  Cardiac: regular HR;  without carotid bruits Abdomen: soft, NT; aortic pulse is not palpable Skin: without rashes Vascular Exam/Pulses:  Right Left  Radial 2+ (normal) 2+ (normal)  Femoral 2+ (normal) 2+ (normal)  Popliteal Unable to palpate Unable to palpate  DP 2+ (normal) 2+ (normal)   Extremities: without open wounds Musculoskeletal: no muscle wasting or atrophy  Neurologic: A&O X 3 Psychiatric:  The pt has Normal affect.   Non-Invasive Vascular Imaging:   EVAR Arterial duplex on 05/06/2024: Endovascular Aortic Repair (EVAR):  +----------+----------------+-------------------+-------------------+           Diameter AP (cm)Diameter Trans (cm)Velocities (cm/sec)  +----------+----------------+-------------------+-------------------+  Aorta    5.49            6.91               58                    +----------+----------------+-------------------+-------------------+  Right Limb1.31            1.33               52                   +----------+----------------+-------------------+-------------------+  Left Limb 2.41            2.77               37                   +----------+----------------+-------------------+-------------------+  Summary:  Abdominal Aorta: Patent endovascular aneurysm repair with no evidence of endoleak.    Previous EVAR arterial duplex on 04/17/2023: Endovascular Aortic Repair (EVAR):  +----------+----------------+-------------------+-------------------+           Diameter AP (cm)Diameter Trans (cm)Velocities (cm/sec)  +----------+----------------+-------------------+-------------------+  Aorta    5.92            6.50               57                   +----------+----------------+-------------------+-------------------+  Right  Limb3.03            2.45               57                   +----------+----------------+-------------------+-------------------+  Left Limb 1.49            1.63               89                   +----------+----------------+-------------------+-------------------+   Summary:  Abdominal Aorta: Patent endovascular aneurysm repair with no evidence of endoleak. Previous diameter measurement was 5.9 x 6.4 cm obtained on 04/04/22     ASSESSMENT/PLAN:: 68 y.o. male here with hx of aortogram with coil embolization of right IIA aneurysm on 07/21/2020 by Dr. Nolene Baumgarten and EVAR on  08/15/2020 by Dr. Nolene Baumgarten.  -on duplex today, aorta measures 6.9cm compared to last year on duplex was 6.5cm.  he did have a CT scan July 2024 where the aorta measured 6.8cm and essentially unchanged.  Discussed this with Dr. Rosalva Comber and will have pt return in 6 months for repeat duplex.  If there are any concerns, we will send him for repeat CT scan for further evaluation.  He has not had popliteal duplex so when he returns in 6  months, we will also get limited BLE duplex to check his popliteal arteries.   -he knows that if he develops sudden severe abdominal or back pain, he is to call 911.   -continue statin.  He does not take asa due to hx of bleeding.  -current smoker as he did start smoking again-discussed smoking puts him at increased risk for limb loss, MI, stroke, cancer, lung problems.  He expressed good understanding.    Maryanna Smart, [facility] [phone]  Clinic MD:   Rosalva Comber

## 2024-05-07 ENCOUNTER — Other Ambulatory Visit: Payer: Self-pay

## 2024-05-07 ENCOUNTER — Other Ambulatory Visit: Payer: Self-pay | Admitting: *Deleted

## 2024-05-07 ENCOUNTER — Inpatient Hospital Stay: Payer: Self-pay | Attending: Nurse Practitioner

## 2024-05-07 DIAGNOSIS — D649 Anemia, unspecified: Secondary | ICD-10-CM

## 2024-05-07 DIAGNOSIS — Z9889 Other specified postprocedural states: Secondary | ICD-10-CM

## 2024-05-07 DIAGNOSIS — D696 Thrombocytopenia, unspecified: Secondary | ICD-10-CM | POA: Diagnosis present

## 2024-05-07 LAB — CBC WITH DIFFERENTIAL (CANCER CENTER ONLY)
Abs Immature Granulocytes: 0.02 10*3/uL (ref 0.00–0.07)
Basophils Absolute: 0 10*3/uL (ref 0.0–0.1)
Basophils Relative: 1 %
Eosinophils Absolute: 0.1 10*3/uL (ref 0.0–0.5)
Eosinophils Relative: 2 %
HCT: 42.4 % (ref 39.0–52.0)
Hemoglobin: 14.8 g/dL (ref 13.0–17.0)
Immature Granulocytes: 0 %
Lymphocytes Relative: 21 %
Lymphs Abs: 1.2 10*3/uL (ref 0.7–4.0)
MCH: 31.9 pg (ref 26.0–34.0)
MCHC: 34.9 g/dL (ref 30.0–36.0)
MCV: 91.4 fL (ref 80.0–100.0)
Monocytes Absolute: 0.6 10*3/uL (ref 0.1–1.0)
Monocytes Relative: 9 %
Neutro Abs: 3.9 10*3/uL (ref 1.7–7.7)
Neutrophils Relative %: 67 %
Platelet Count: 118 10*3/uL — ABNORMAL LOW (ref 150–400)
RBC: 4.64 MIL/uL (ref 4.22–5.81)
RDW: 14 % (ref 11.5–15.5)
WBC Count: 5.9 10*3/uL (ref 4.0–10.5)
nRBC: 0 % (ref 0.0–0.2)

## 2024-05-07 LAB — VITAMIN B12: Vitamin B-12: 338 pg/mL (ref 180–914)

## 2024-05-07 LAB — FERRITIN: Ferritin: 114 ng/mL (ref 24–336)

## 2024-05-25 ENCOUNTER — Ambulatory Visit: Payer: Self-pay | Admitting: Nurse Practitioner

## 2024-08-04 ENCOUNTER — Other Ambulatory Visit: Payer: Self-pay | Admitting: Physician Assistant

## 2024-09-23 ENCOUNTER — Other Ambulatory Visit: Payer: Self-pay | Admitting: Physician Assistant

## 2024-10-05 ENCOUNTER — Other Ambulatory Visit: Payer: Self-pay | Admitting: Physician Assistant

## 2024-10-21 ENCOUNTER — Other Ambulatory Visit: Payer: Self-pay | Admitting: Physician Assistant

## 2024-10-21 ENCOUNTER — Encounter (HOSPITAL_COMMUNITY)

## 2024-10-21 ENCOUNTER — Other Ambulatory Visit (HOSPITAL_COMMUNITY)

## 2024-10-21 ENCOUNTER — Ambulatory Visit

## 2024-11-01 ENCOUNTER — Other Ambulatory Visit: Payer: Self-pay | Admitting: Nurse Practitioner

## 2024-11-01 DIAGNOSIS — D696 Thrombocytopenia, unspecified: Secondary | ICD-10-CM

## 2024-11-01 NOTE — Progress Notes (Unsigned)
 Patient Care Team: Hugh Charleston, MD (Inactive) as PCP - General (Family Medicine) Dann Candyce RAMAN, MD as PCP - Cardiology (Cardiology)  Clinic Day:  11/03/2024  Referring physician: No ref. provider found  ASSESSMENT & PLAN:   Assessment & Plan: Thrombocytopenia This has been an intermittent issue since 2021.  At that time, he went to see his primary care because of an episode of significant blood in the urine.  A CT scan was done which found the bladder and kidneys clear, but discovered he had an abdominal aortic aneurysm.  This was repaired in September 2021.  This was first episode of known thrombocytopenia.  Platelets had dropped to 109 with some clumping following his AAA repair.  Hemoglobin was 11.3 and hematocrit was 33.8.  Subsequent checks showed normalized CBC.  09/03/2021 and 09/13/2021, CBCs were both normal.  Colonoscopy was performed in 10/07/2021.  He had an adenomatous polyp.  Repeat expected in 5 years.In early 2023, patient started having trouble with epigastric pain.  Was seen by his GI provider.  Labs showed platelet count of 129.  Hemoglobin was 12.5 and hematocrit was 39.0.  He had EGD done which was positive for duodenal ulcers in May 2023.  Was started on medication to help with acid reflux symptoms.  CBC then rebounded with platelet count of 149, hemoglobin 13.1, and hematocrit 40.2.  On 03/17/2023, prior to hip replacement surgery, patient had a normal CBC.  His platelets were 153, hemoglobin 14.4, and hematocrit 43.2.  Recent physical with his primary care on 09/30/2023, platelets were back down to 133.  His hemoglobin is normal at 15.0 and hematocrit is normal at 44.4.  Between April and November 2024, patient did have hip placement surgery.  Looking back, each episode of leukocytopenia, every episode was preceded by a surgery or illness.  Platelets remain> 100 and generally<150.  He was checked for hep otitis C August 2020, which was negative.  Liver functions have  been historically normal despite abnormal CBC. 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  Historical imaging of liver has been normal.  Most recently, imaged and July 2024.  His thrombocytopenia is very mild with platelets never dropping below 100.  This is likely something we will continue to monitor.  Advised him of signs and symptoms of overt bleeding as well as more subtle signs of bleeding.  He and his wife both voiced understanding. Today we will check labs which include CBC with differential, immature platelet count, B12, ferritin, save smear, and folate.  Will notify him of results via MyChart message.  Plan to monitor his labs every 6 months with a follow-up in 12 months.    Thrombocytopenia Stable.  Platelet count 125 today.  Patient states he saw urologist in October 2025.  He states that labs were done due to presence of blood in his urine.  Platelet count was  152.  Will continue to monitor with checks every 6 months and follow-up in 1 year.  Prostatomegaly Patient on finasteride .  He is now scheduled to have CT of the pelvis  and cystoscopy due to presence of blood in his urine.  Discussed results.  Sees [facility].  Has asked that they send labs and CT results to this office.  Plan Labs reviewed. - CBC stable with improved platelet count from June 2025. Check labs in 6 months. Labs and follow-up in 12 months.  The patient understands the plans discussed today and is  in agreement with them.  He knows to contact our office if he develops concerns prior to his next appointment.  I provided 20 minutes of face-to-face time during this encounter and > 50% was spent counseling as documented under my assessment and plan.    Benjamin FORBES Lessen, Benjamin Preston  [facility] - A DEPT OF [facility] [address] [place] [address] Dept: [phone] Dept Fax:  [phone]   No orders of the defined types were placed in this encounter.     CHIEF COMPLAINT:  CC: Thrombocytopenia  Current Treatment:  surveillance   INTERVAL HISTORY:  Benjamin Preston is here today for repeat clinical assessment.  He was last seen by me on 10/28/2023.  Most recent blood work from 05/07/2024 showing normal CBC with mildly decreased platelet counts.  States has been doing well.  Did see his urologist in October 2025 due to blood in his urine.  Was treated with antibiotics due to presumed UTI.  Culture was negative.  Will now have CT pelvis and cystoscopy for further evaluation.  Patient reports blood has resolved.  He denies new problems or concerns.  He denies chest pain, chest pressure, or shortness of breath. He denies headaches or visual disturbances. He denies abdominal pain, nausea, vomiting, or changes in bowel or bladder habits.   He denies fevers or chills. He denies pain. His appetite is good. His weight has been stable.  I have reviewed the past medical history, past surgical history, social history and family history with the patient and they are unchanged from previous note.  ALLERGIES:  has no known allergies.  MEDICATIONS:  Current Outpatient Medications  Medication Sig Dispense Refill   ALPRAZolam  (XANAX ) 0.5 MG tablet Take 0.25-0.5 mg by mouth daily as needed for anxiety.      aspirin -sod bicarb-citric acid (ALKA-SELTZER) 325 MG TBEF tablet Take 325 mg by mouth every 6 (six) hours as needed (indigestion/heartburn/upset stomach).     calcium  carbonate (TUMS - DOSED IN MG ELEMENTAL CALCIUM ) 500 MG chewable tablet Chew 2-3 tablets by mouth 3 (three) times daily as needed for indigestion or heartburn.     escitalopram  (LEXAPRO ) 10 MG tablet Take 10 mg by mouth daily.     finasteride  (PROSCAR ) 5 MG tablet Take 5 mg by mouth daily.     ibuprofen (ADVIL) 200 MG tablet Take 200-400 mg by mouth every 6 (six) hours as needed (pain.).     iron polysaccharides (NIFEREX)  150 MG capsule Take 150 mg by mouth daily.     metoprolol  succinate (TOPROL -XL) 50 MG 24 hr tablet Take 1 tablet (50 mg total) by mouth daily. WITH OR IMMEDIATELY FOLLOWING A MEAL. Pt must schedule an overdue followup appt with cardiology for any more refills. [id number] 3rd and FINAL attempt Thank You 15 tablet 0   nicotine (NICOTROL) 10 MG inhaler Inhale 1 continuous puffing into the lungs as needed for smoking cessation.     omeprazole (PRILOSEC) 40 MG capsule Take 1 capsule by mouth daily.     rosuvastatin  (CRESTOR ) 20 MG tablet Take 1 tablet (20 mg total) by mouth daily. 90 tablet 3   vitamin B-12 (CYANOCOBALAMIN ) 1000 MCG tablet Take 1,000 mcg by mouth daily.     No current facility-administered medications for this visit.       REVIEW OF SYSTEMS:   Constitutional: Denies fevers, chills or abnormal weight loss Eyes: Denies blurriness of vision Ears, nose, mouth, throat, and  face: Denies mucositis or sore throat Respiratory: Denies cough, dyspnea or wheezes Cardiovascular: Denies palpitation, chest discomfort or lower extremity swelling Gastrointestinal:  Denies nausea, heartburn or change in bowel habits Skin: Denies abnormal skin rashes Lymphatics: Denies new lymphadenopathy or easy bruising Neurological:Denies numbness, tingling or new weaknesses Behavioral/Psych: Mood is stable, no new changes  All other systems were reviewed with the patient and are negative.   VITALS:   Today's Vitals   11/03/24 1328 11/03/24 1340  BP: 138/88   Pulse: 69   Resp: 17   Temp: 98.3 F (36.8 C)   SpO2: 97%   Weight: 230 lb 6.4 oz (104.5 kg)   PainSc:  0-No pain   Body mass index is 32.13 kg/m.   Wt Readings from Last 3 Encounters:  11/03/24 230 lb 6.4 oz (104.5 kg)  05/06/24 234 lb 8 oz (106.4 kg)  10/28/23 225 lb 9.6 oz (102.3 kg)    Body mass index is 32.13 kg/m.  Performance status (ECOG): 0 - Asymptomatic  PHYSICAL EXAM:   GENERAL:alert, no distress and  comfortable SKIN: skin color, texture, turgor are normal, no rashes or significant lesions EYES: normal, Conjunctiva are pink and non-injected, sclera clear OROPHARYNX:no exudate, no erythema and lips, buccal mucosa, and tongue normal  NECK: supple, thyroid normal size, non-tender, without nodularity LYMPH:  no palpable lymphadenopathy in the cervical, axillary or inguinal LUNGS: clear to auscultation and percussion with normal breathing effort HEART: regular rate & rhythm and no murmurs and no lower extremity edema ABDOMEN:abdomen soft, non-tender and normal bowel sounds Musculoskeletal:no cyanosis of digits and no clubbing  NEURO: alert & oriented x 3 with fluent speech, no focal motor/sensory deficits  LABORATORY DATA:  I have reviewed the data as listed    Component Value Date/Time   NA 138 08/16/2020 0406   K 4.5 08/16/2020 0406   CL 107 08/16/2020 0406   CO2 23 08/16/2020 0406   GLUCOSE 108 (H) 08/16/2020 0406   BUN 16 08/16/2020 0406   CREATININE 1.02 08/16/2020 0406   CALCIUM  8.5 (L) 08/16/2020 0406   PROT 6.6 08/14/2020 1200   ALBUMIN 4.1 08/14/2020 1200   AST 25 08/14/2020 1200   ALT 27 08/14/2020 1200   ALKPHOS 44 08/14/2020 1200   BILITOT 0.4 08/14/2020 1200   GFRNONAA >60 08/16/2020 0406   GFRAA >60 08/16/2020 0406     Lab Results  Component Value Date   WBC 6.5 11/03/2024   NEUTROABS 4.5 11/03/2024   HGB 14.4 11/03/2024   HCT 42.1 11/03/2024   MCV 92.3 11/03/2024   PLT 125 (L) 11/03/2024

## 2024-11-02 ENCOUNTER — Ambulatory Visit: Payer: BC Managed Care – PPO | Admitting: Nurse Practitioner

## 2024-11-02 ENCOUNTER — Other Ambulatory Visit: Payer: BC Managed Care – PPO

## 2024-11-03 ENCOUNTER — Encounter: Payer: Self-pay | Admitting: Nurse Practitioner

## 2024-11-03 ENCOUNTER — Inpatient Hospital Stay: Payer: Self-pay | Admitting: Nurse Practitioner

## 2024-11-03 ENCOUNTER — Inpatient Hospital Stay: Payer: Self-pay | Attending: Nurse Practitioner

## 2024-11-03 VITALS — BP 138/88 | HR 69 | Temp 98.3°F | Resp 17 | Wt 230.4 lb

## 2024-11-03 DIAGNOSIS — N400 Enlarged prostate without lower urinary tract symptoms: Secondary | ICD-10-CM | POA: Diagnosis not present

## 2024-11-03 DIAGNOSIS — D649 Anemia, unspecified: Secondary | ICD-10-CM

## 2024-11-03 DIAGNOSIS — D696 Thrombocytopenia, unspecified: Secondary | ICD-10-CM | POA: Insufficient documentation

## 2024-11-03 LAB — CBC WITH DIFFERENTIAL (CANCER CENTER ONLY)
Abs Immature Granulocytes: 0.01 K/uL (ref 0.00–0.07)
Basophils Absolute: 0 K/uL (ref 0.0–0.1)
Basophils Relative: 1 %
Eosinophils Absolute: 0.2 K/uL (ref 0.0–0.5)
Eosinophils Relative: 3 %
HCT: 42.1 % (ref 39.0–52.0)
Hemoglobin: 14.4 g/dL (ref 13.0–17.0)
Immature Granulocytes: 0 %
Lymphocytes Relative: 21 %
Lymphs Abs: 1.4 K/uL (ref 0.7–4.0)
MCH: 31.6 pg (ref 26.0–34.0)
MCHC: 34.2 g/dL (ref 30.0–36.0)
MCV: 92.3 fL (ref 80.0–100.0)
Monocytes Absolute: 0.5 K/uL (ref 0.1–1.0)
Monocytes Relative: 8 %
Neutro Abs: 4.5 K/uL (ref 1.7–7.7)
Neutrophils Relative %: 67 %
Platelet Count: 125 K/uL — ABNORMAL LOW (ref 150–400)
RBC: 4.56 MIL/uL (ref 4.22–5.81)
RDW: 14.3 % (ref 11.5–15.5)
WBC Count: 6.5 K/uL (ref 4.0–10.5)
nRBC: 0 % (ref 0.0–0.2)

## 2024-11-03 LAB — FERRITIN: Ferritin: 101 ng/mL (ref 24–336)

## 2024-11-03 LAB — VITAMIN B12: Vitamin B-12: 413 pg/mL (ref 180–914)

## 2024-11-03 NOTE — Assessment & Plan Note (Addendum)
 This has been an intermittent issue since 2021.  At that time, he went to see his primary care because of an episode of significant blood in the urine.  A CT scan was done which found the bladder and kidneys clear, but discovered he had an abdominal aortic aneurysm.  This was repaired in September 2021.  This was first episode of known thrombocytopenia.  Platelets had dropped to 109 with some clumping following his AAA repair.  Hemoglobin was 11.3 and hematocrit was 33.8.  Subsequent checks showed normalized CBC.  09/03/2021 and 09/13/2021, CBCs were both normal.  Colonoscopy was performed in 10/07/2021.  He had an adenomatous polyp.  Repeat expected in 5 years.In early 2023, patient started having trouble with epigastric pain.  Was seen by his GI provider.  Labs showed platelet count of 129.  Hemoglobin was 12.5 and hematocrit was 39.0.  He had EGD done which was positive for duodenal ulcers in May 2023.  Was started on medication to help with acid reflux symptoms.  CBC then rebounded with platelet count of 149, hemoglobin 13.1, and hematocrit 40.2.  On 03/17/2023, prior to hip replacement surgery, patient had a normal CBC.  His platelets were 153, hemoglobin 14.4, and hematocrit 43.2.  Recent physical with his primary care on 09/30/2023, platelets were back down to 133.  His hemoglobin is normal at 15.0 and hematocrit is normal at 44.4.  Between April and November 2024, patient did have hip placement surgery.  Looking back, each episode of leukocytopenia, every episode was preceded by a surgery or illness.  Platelets remain> 100 and generally<150.  He was checked for hep otitis C August 2020, which was negative.  Liver functions have been historically normal despite abnormal CBC. 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  Historical imaging of liver has been normal.  Most recently, imaged and July 2024.  His thrombocytopenia is very mild with platelets  never dropping below 100.  This is likely something we will continue to monitor.  Advised him of signs and symptoms of overt bleeding as well as more subtle signs of bleeding.  He and his wife both voiced understanding. Today we will check labs which include CBC with differential, immature platelet count, B12, ferritin, save smear, and folate.  Will notify him of results via MyChart message.  Plan to monitor his labs every 6 months with a follow-up in 12 months.

## 2024-11-03 NOTE — Progress Notes (Unsigned)
 Office Note     CC:  follow up Requesting Provider:  [place] Lucie PARAS, PA-C  HPI: Benjamin Preston is a 68 y.o. (March 13, 1956) male who presents for surveillance follow up of PAD.    Past Medical History:  Diagnosis Date   AAA (abdominal aortic aneurysm)    Adenomatous colon polyp    Anxiety    Depression    GERD (gastroesophageal reflux disease)    Headache, cluster, episodic    Hematuria    Hypercholesterolemia    Hyperlipidemia    Hypertension    Recovering alcoholic (HCC)     Past Surgical History:  Procedure Laterality Date   ABDOMINAL AORTIC ENDOVASCULAR STENT GRAFT N/A 08/15/2020   Procedure: ABDOMINAL AORTIC ENDOVASCULAR STENT GRAFT;  Surgeon: Harvey Carlin BRAVO, MD;  Location: [facility] OR;  Service: Vascular;  Laterality: N/A;   EMBOLIZATION (CATH LAB) Right 07/21/2020   Procedure: EMBOLIZATION;  Surgeon: Harvey Carlin BRAVO, MD;  Location: [facility] IN[facility];  Service: Cardiovascular;  Laterality: Right;  external iliac   FEMORAL ARTERY EXPLORATION Right 08/15/2020   Procedure: RIGHT FEMORAL ARTERY EXPLORATION AND REPAIR;  Surgeon: Harvey Carlin BRAVO, MD;  Location: [facility] OR;  Service: Vascular;  Laterality: Right;   TENDON GRAFT     TONSILLECTOMY     ULTRASOUND GUIDANCE FOR VASCULAR ACCESS Bilateral 08/15/2020   Procedure: ULTRASOUND GUIDANCE FOR VASCULAR ACCESS;  Surgeon: Harvey Carlin BRAVO, MD;  Location: [facility] OR;  Service: Vascular;  Laterality: Bilateral;    Social History   Socioeconomic History   Marital status: Married    Spouse name: Not on file   Number of children: Not on file   Years of education: Not on file   Highest education level: Not on file  Occupational History   Not on file  Tobacco Use   Smoking status: Every Day    Current packs/day: 0.25    Types: Cigarettes    Passive exposure: Never   Smokeless tobacco: Never  Vaping Use   Vaping status: Never Used  Substance and Sexual Activity   Alcohol use: Not Currently   Drug use: Never   Sexual activity:  Not on file  Other Topics Concern   Not on file  Social History Narrative   Not on file   Social Drivers of Health   Tobacco Use: High Risk (08/29/2024)   [facility]   Patient History    Smoking Tobacco Use: Some Days    Smokeless Tobacco Use: Never    Passive Exposure: Not on file  Financial Resource Strain: Not on file  Food Insecurity: No Food Insecurity (10/28/2023)   Hunger Vital Sign    Worried About Running Out of Food in the Last Year: Never true    Ran Out of Food in the Last Year: Never true  Transportation Needs: No Transportation Needs (10/28/2023)   PRAPARE - Administrator, Civil Service (Medical): No    Lack of Transportation (Non-Medical): No  Physical Activity: Not on file  Stress: Not on file  Social Connections: Not on file  Intimate Partner Violence: Not At Risk (10/28/2023)   Humiliation, Afraid, Rape, and Kick questionnaire    Fear of Current or Ex-Partner: No    Emotionally Abused: No    Physically Abused: No    Sexually Abused: No  Depression (PHQ2-9): Low Risk (10/28/2023)   Depression (PHQ2-9)    PHQ-2 Score: 0  Alcohol Screen: Not on file  Housing: Unknown (08/29/2024)   Received from  [facility]   Epic    Unable to Pay for Housing in the Last Year: Not on file    Number of Times Moved in the Last Year: Not on file    At any time in the past 12 months, were you homeless or living in a shelter (including now)?: No  Utilities: Not At Risk (10/28/2023)   [facility] Utilities    Threatened with loss of utilities: No  Health Literacy: Not on file    Family History  Problem Relation Age of Onset   Mesothelioma Mother    Diverticulitis Mother    Prostate cancer Father     Current Outpatient Medications  Medication Sig Dispense Refill   ALPRAZolam  (XANAX ) 0.5 MG tablet Take 0.25-0.5 mg by mouth daily as needed for anxiety.      aspirin -sod bicarb-citric acid (ALKA-SELTZER) 325 MG TBEF  tablet Take 325 mg by mouth every 6 (six) hours as needed (indigestion/heartburn/upset stomach).     calcium  carbonate (TUMS - DOSED IN MG ELEMENTAL CALCIUM ) 500 MG chewable tablet Chew 2-3 tablets by mouth 3 (three) times daily as needed for indigestion or heartburn.     escitalopram  (LEXAPRO ) 10 MG tablet Take 10 mg by mouth daily.     finasteride  (PROSCAR ) 5 MG tablet Take 5 mg by mouth daily.     ibuprofen (ADVIL) 200 MG tablet Take 200-400 mg by mouth every 6 (six) hours as needed (pain.).     iron polysaccharides (NIFEREX) 150 MG capsule Take 150 mg by mouth daily.     metoprolol  succinate (TOPROL -XL) 50 MG 24 hr tablet Take 1 tablet (50 mg total) by mouth daily. WITH OR IMMEDIATELY FOLLOWING A MEAL. Pt must schedule an overdue followup appt with cardiology for any more refills. [phone] 3rd and FINAL attempt Thank You 15 tablet 0   nicotine (NICOTROL) 10 MG inhaler Inhale 1 continuous puffing into the lungs as needed for smoking cessation.     omeprazole (PRILOSEC) 40 MG capsule Take 1 capsule by mouth daily.     rosuvastatin  (CRESTOR ) 20 MG tablet Take 1 tablet (20 mg total) by mouth daily. 90 tablet 3   vitamin B-12 (CYANOCOBALAMIN ) 1000 MCG tablet Take 1,000 mcg by mouth daily.     No current facility-administered medications for this visit.    Allergies[1]   REVIEW OF SYSTEMS:  Negative unless noted in HPI [X]  denotes positive finding, [ ]  denotes negative finding Cardiac  Comments:  Chest pain or chest pressure:    Shortness of breath upon exertion:    Short of breath when lying flat:    Irregular heart rhythm:        Vascular    Pain in calf, thigh, or hip brought on by ambulation:    Pain in feet at night that wakes you up from your sleep:     Blood clot in your veins:    Leg swelling:         Pulmonary    Oxygen at home:    Productive cough:     Wheezing:         Neurologic    Sudden weakness in arms or legs:     Sudden numbness in arms or legs:     Sudden  onset of difficulty speaking or slurred speech:    Temporary loss of vision in one eye:     Problems with dizziness:         Gastrointestinal    Blood in stool:  Vomited blood:         Genitourinary    Burning when urinating:     Blood in urine:        Psychiatric    Major depression:         Hematologic    Bleeding problems:    Problems with blood clotting too easily:        Skin    Rashes or ulcers:        Constitutional    Fever or chills:      PHYSICAL EXAMINATION:  There were no vitals filed for this visit.***  General:  WDWN in NAD; vital signs documented above Gait: Not observed HENT: WNL, normocephalic Pulmonary: normal non-labored breathing Cardiac: {Desc; regular/irreg:14544} HR Abdomen: soft Vascular Exam/Pulses: *** Extremities: {With/Without:20273} ischemic changes, {With/Without:20273} Gangrene , {With/Without:20273} cellulitis; {With/Without:20273} open wounds;  Musculoskeletal: no muscle wasting or atrophy  Neurologic: A&O X 3 Psychiatric:  The pt has Normal affect.   Non-Invasive Vascular Imaging:   ***    ASSESSMENT/PLAN:: 68 y.o. male here for follow up for ***   -***   Teretha Damme, PA-C [facility] [phone]  Clinic MD:   [place]    [1] No Known Allergies

## 2024-11-04 ENCOUNTER — Ambulatory Visit (HOSPITAL_COMMUNITY): Admission: RE | Admit: 2024-11-04

## 2024-11-04 ENCOUNTER — Ambulatory Visit (HOSPITAL_COMMUNITY)
Admission: RE | Admit: 2024-11-04 | Discharge: 2024-11-04 | Attending: Physician Assistant | Admitting: Physician Assistant

## 2024-11-04 ENCOUNTER — Ambulatory Visit

## 2024-11-04 VITALS — BP 128/85 | HR 57 | Temp 97.9°F | Wt 223.1 lb

## 2024-11-04 DIAGNOSIS — Z9889 Other specified postprocedural states: Secondary | ICD-10-CM | POA: Insufficient documentation

## 2024-11-04 DIAGNOSIS — I723 Aneurysm of iliac artery: Secondary | ICD-10-CM | POA: Diagnosis present

## 2024-11-04 DIAGNOSIS — I714 Abdominal aortic aneurysm, without rupture, unspecified: Secondary | ICD-10-CM | POA: Insufficient documentation

## 2024-11-08 ENCOUNTER — Other Ambulatory Visit: Payer: Self-pay | Admitting: Physician Assistant

## 2025-05-04 ENCOUNTER — Inpatient Hospital Stay: Attending: Nurse Practitioner

## 2025-11-09 ENCOUNTER — Inpatient Hospital Stay

## 2025-11-09 ENCOUNTER — Inpatient Hospital Stay: Admitting: Nurse Practitioner
